# Patient Record
Sex: Female | Born: 1975 | Race: Black or African American | Hispanic: No | Marital: Single | Smoking: Never smoker
Health system: Southern US, Community
[De-identification: ages and names within clinical notes are randomized; demographics above are authoritative.]

## PROBLEM LIST (undated history)

## (undated) ENCOUNTER — Emergency Department (HOSPITAL_COMMUNITY): Discharge status: Absent without leave | Payer: Self-pay

---

## 1997-08-24 ENCOUNTER — Encounter: Admission: RE | Admit: 1997-08-24 | Discharge: 1997-08-24 | Payer: Self-pay | Admitting: Family Medicine

## 1997-09-07 ENCOUNTER — Encounter: Admission: RE | Admit: 1997-09-07 | Discharge: 1997-09-07 | Payer: Self-pay | Admitting: Family Medicine

## 1997-09-11 ENCOUNTER — Encounter: Admission: RE | Admit: 1997-09-11 | Discharge: 1997-09-11 | Payer: Self-pay | Admitting: Family Medicine

## 1998-07-19 ENCOUNTER — Encounter: Admission: RE | Admit: 1998-07-19 | Discharge: 1998-07-19 | Payer: Self-pay | Admitting: Family Medicine

## 1998-07-26 ENCOUNTER — Encounter: Admission: RE | Admit: 1998-07-26 | Discharge: 1998-07-26 | Payer: Self-pay | Admitting: Family Medicine

## 2001-01-27 ENCOUNTER — Encounter: Payer: Self-pay | Admitting: Emergency Medicine

## 2001-01-27 ENCOUNTER — Emergency Department (HOSPITAL_COMMUNITY): Admission: EM | Admit: 2001-01-27 | Discharge: 2001-01-27 | Payer: Self-pay | Admitting: Emergency Medicine

## 2001-10-03 ENCOUNTER — Other Ambulatory Visit: Admission: RE | Admit: 2001-10-03 | Discharge: 2001-10-03 | Payer: Self-pay | Admitting: *Deleted

## 2002-03-16 ENCOUNTER — Inpatient Hospital Stay (HOSPITAL_COMMUNITY): Admission: AD | Admit: 2002-03-16 | Discharge: 2002-03-19 | Payer: Self-pay | Admitting: *Deleted

## 2002-12-25 ENCOUNTER — Ambulatory Visit (HOSPITAL_COMMUNITY): Admission: RE | Admit: 2002-12-25 | Discharge: 2002-12-25 | Payer: Self-pay | Admitting: Obstetrics & Gynecology

## 2003-04-03 ENCOUNTER — Ambulatory Visit (HOSPITAL_COMMUNITY): Admission: RE | Admit: 2003-04-03 | Discharge: 2003-04-03 | Payer: Self-pay | Admitting: Obstetrics & Gynecology

## 2003-05-30 ENCOUNTER — Inpatient Hospital Stay (HOSPITAL_COMMUNITY): Admission: AD | Admit: 2003-05-30 | Discharge: 2003-06-01 | Payer: Self-pay | Admitting: Obstetrics

## 2003-07-25 ENCOUNTER — Emergency Department (HOSPITAL_COMMUNITY): Admission: EM | Admit: 2003-07-25 | Discharge: 2003-07-25 | Payer: Self-pay | Admitting: Emergency Medicine

## 2003-11-01 ENCOUNTER — Emergency Department (HOSPITAL_COMMUNITY): Admission: EM | Admit: 2003-11-01 | Discharge: 2003-11-01 | Payer: Self-pay | Admitting: Emergency Medicine

## 2005-07-05 ENCOUNTER — Emergency Department (HOSPITAL_COMMUNITY): Admission: EM | Admit: 2005-07-05 | Discharge: 2005-07-05 | Payer: Self-pay | Admitting: Emergency Medicine

## 2005-10-14 ENCOUNTER — Ambulatory Visit (HOSPITAL_COMMUNITY): Admission: RE | Admit: 2005-10-14 | Discharge: 2005-10-14 | Payer: Self-pay | Admitting: Obstetrics & Gynecology

## 2006-03-21 ENCOUNTER — Inpatient Hospital Stay (HOSPITAL_COMMUNITY): Admission: AD | Admit: 2006-03-21 | Discharge: 2006-03-23 | Payer: Self-pay | Admitting: Obstetrics
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
## 2010-12-29 ENCOUNTER — Emergency Department (INDEPENDENT_AMBULATORY_CARE_PROVIDER_SITE_OTHER)
Admission: EM | Admit: 2010-12-29 | Discharge: 2010-12-29 | Disposition: A | Discharge status: Hospice | Payer: Self-pay | Source: Home / Self Care | Attending: Emergency Medicine | Admitting: Emergency Medicine

## 2010-12-29 DIAGNOSIS — S93409A Sprain of unspecified ligament of unspecified ankle, initial encounter: Secondary | ICD-10-CM

## 2010-12-29 DIAGNOSIS — IMO0002 Reserved for concepts with insufficient information to code with codable children: Secondary | ICD-10-CM

## 2010-12-29 DIAGNOSIS — S7010XA Contusion of unspecified thigh, initial encounter: Secondary | ICD-10-CM

## 2010-12-29 MED ORDER — CYCLOBENZAPRINE HCL 10 MG PO TABS: 10.0000 mg | ORAL_TABLET | Freq: Three times a day (TID) | ORAL | Status: AC

## 2010-12-29 MED ORDER — MELOXICAM 7.5 MG PO TABS: 7.5000 mg | ORAL_TABLET | Freq: Every day | ORAL | Status: AC
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
## 2012-06-21 ENCOUNTER — Ambulatory Visit: Payer: Self-pay | Admitting: Obstetrics

## 2012-11-03 ENCOUNTER — Encounter: Payer: Self-pay | Admitting: Obstetrics & Gynecology

## 2012-11-03 ENCOUNTER — Ambulatory Visit (INDEPENDENT_AMBULATORY_CARE_PROVIDER_SITE_OTHER): Payer: No Typology Code available for payment source | Admitting: Obstetrics & Gynecology

## 2012-11-03 VITALS — BP 126/89 | HR 73 | Temp 97.2°F | Wt 138.0 lb

## 2012-11-03 DIAGNOSIS — Z3202 Encounter for pregnancy test, result negative: Secondary | ICD-10-CM

## 2012-11-03 DIAGNOSIS — Z30430 Encounter for insertion of intrauterine contraceptive device: Secondary | ICD-10-CM

## 2012-11-03 DIAGNOSIS — Z30432 Encounter for removal of intrauterine contraceptive device: Secondary | ICD-10-CM

## 2012-11-03 DIAGNOSIS — Z975 Presence of (intrauterine) contraceptive device: Secondary | ICD-10-CM

## 2012-11-03 LAB — POCT URINE PREGNANCY: Preg Test, Ur: NEGATIVE

## 2012-11-03 MED ORDER — LEVONORGESTREL 20 MCG/24HR IU IUD: 1.0000 | INTRAUTERINE_SYSTEM | Freq: Once | INTRAUTERINE | Status: DC

## 2012-11-03 NOTE — Progress Notes (Signed)
IUD Removal/Insertion Procedure Note    Procedure Details  Urine pregnancy test was done  and result was negative.  The risks (including infection, bleeding, pain, and uterine perforation) and benefits of the procedure were explained to the patient and Written informed consent was obtained.    Cervix cleansed with Betadine. The IUD strings were grasped and the IUD was removed intact.  Uterus sounded to 9 cm. IUD inserted without difficulty. String visible and trimmed. Patient tolerated procedure well.  An informal U/S showed the IUD in a good position  Condition: Stable  Complications: None  Plan:  The patient was advised to call for any fever or for prolonged or severe pain or bleeding. She was advised to use OTC ibuprofen as needed for mild to moderate pain.

## 2012-11-03 NOTE — Patient Instructions (Signed)
Levonorgestrel intrauterine device (IUD) What is this medicine? LEVONORGESTREL IUD (LEE voe nor jes trel) is a contraceptive (birth control) device. The device is placed inside the uterus by a healthcare professional. It is used to prevent pregnancy and can also be used to treat heavy bleeding that occurs during your period. Depending on the device, it can be used for 3 to 5 years. This medicine may be used for other purposes; ask your health care provider or pharmacist if you have questions. What should I tell my health care provider before I take this medicine? They need to know if you have any of these conditions: -abnormal Pap smear -cancer of the breast, uterus, or cervix -diabetes -endometritis -genital or pelvic infection now or in the past -have more than one sexual partner or your partner has more than one partner -heart disease -history of an ectopic or tubal pregnancy -immune system problems -IUD in place -liver disease or tumor -problems with blood clots or take blood-thinners -use intravenous drugs -uterus of unusual shape -vaginal bleeding that has not been explained -an unusual or allergic reaction to levonorgestrel, other hormones, silicone, or polyethylene, medicines, foods, dyes, or preservatives -pregnant or trying to get pregnant -breast-feeding How should I use this medicine? This device is placed inside the uterus by a health care professional. Talk to your pediatrician regarding the use of this medicine in children. Special care may be needed. Overdosage: If you think you have taken too much of this medicine contact a poison control center or emergency room at once. NOTE: This medicine is only for you. Do not share this medicine with others. What if I miss a dose? This does not apply. What may interact with this medicine? Do not take this medicine with any of the following medications: -amprenavir -bosentan -fosamprenavir This medicine may also interact with  the following medications: -aprepitant -barbiturate medicines for inducing sleep or treating seizures -bexarotene -griseofulvin -medicines to treat seizures like carbamazepine, ethotoin, felbamate, oxcarbazepine, phenytoin, topiramate -modafinil -pioglitazone -rifabutin -rifampin -rifapentine -some medicines to treat HIV infection like atazanavir, indinavir, lopinavir, nelfinavir, tipranavir, ritonavir -St. John's wort -warfarin This list may not describe all possible interactions. Give your health care provider a list of all the medicines, herbs, non-prescription drugs, or dietary supplements you use. Also tell them if you smoke, drink alcohol, or use illegal drugs. Some items may interact with your medicine. What should I watch for while using this medicine? Visit your doctor or health care professional for regular check ups. See your doctor if you or your partner has sexual contact with others, becomes HIV positive, or gets a sexual transmitted disease. This product does not protect you against HIV infection (AIDS) or other sexually transmitted diseases. You can check the placement of the IUD yourself by reaching up to the top of your vagina with clean fingers to feel the threads. Do not pull on the threads. It is a good habit to check placement after each menstrual period. Call your doctor right away if you feel more of the IUD than just the threads or if you cannot feel the threads at all. The IUD may come out by itself. You may become pregnant if the device comes out. If you notice that the IUD has come out use a backup birth control method like condoms and call your health care provider. Using tampons will not change the position of the IUD and are okay to use during your period. What side effects may I notice from receiving this medicine?   Side effects that you should report to your doctor or health care professional as soon as possible: -allergic reactions like skin rash, itching or  hives, swelling of the face, lips, or tongue -fever, flu-like symptoms -genital sores -high blood pressure -no menstrual period for 6 weeks during use -pain, swelling, warmth in the leg -pelvic pain or tenderness -severe or sudden headache -signs of pregnancy -stomach cramping -sudden shortness of breath -trouble with balance, talking, or walking -unusual vaginal bleeding, discharge -yellowing of the eyes or skin Side effects that usually do not require medical attention (report to your doctor or health care professional if they continue or are bothersome): -acne -breast pain -change in sex drive or performance -changes in weight -cramping, dizziness, or faintness while the device is being inserted -headache -irregular menstrual bleeding within first 3 to 6 months of use -nausea This list may not describe all possible side effects. Call your doctor for medical advice about side effects. You may report side effects to FDA at 1-800-FDA-1088. Where should I keep my medicine? This does not apply. NOTE: This sheet is a summary. It may not cover all possible information. If you have questions about this medicine, talk to your doctor, pharmacist, or health care provider.  2013, [employer]. (03/12/2011 1:54:04 PM)  

## 2019-02-20 ENCOUNTER — Ambulatory Visit (HOSPITAL_COMMUNITY)
Admission: EM | Admit: 2019-02-20 | Discharge: 2019-02-20 | Disposition: A | Discharge status: Home / Self Care | Payer: Self-pay

## 2019-02-20 NOTE — ED Notes (Signed)
Patient left during registration because she did not want to pay the copay.

## 2023-05-22 ENCOUNTER — Emergency Department (HOSPITAL_BASED_OUTPATIENT_CLINIC_OR_DEPARTMENT_OTHER)
Admission: EM | Admit: 2023-05-22 | Discharge: 2023-05-22 | Disposition: A | Discharge status: Home / Self Care | Attending: Emergency Medicine | Admitting: Emergency Medicine

## 2023-05-22 ENCOUNTER — Other Ambulatory Visit: Payer: Self-pay

## 2023-05-22 ENCOUNTER — Other Ambulatory Visit (HOSPITAL_BASED_OUTPATIENT_CLINIC_OR_DEPARTMENT_OTHER): Payer: Self-pay

## 2023-05-22 ENCOUNTER — Emergency Department (HOSPITAL_BASED_OUTPATIENT_CLINIC_OR_DEPARTMENT_OTHER): Admitting: Radiology

## 2023-05-22 DIAGNOSIS — X58XXXA Exposure to other specified factors, initial encounter: Secondary | ICD-10-CM | POA: Insufficient documentation

## 2023-05-22 DIAGNOSIS — S39012A Strain of muscle, fascia and tendon of lower back, initial encounter: Secondary | ICD-10-CM | POA: Insufficient documentation

## 2023-05-22 DIAGNOSIS — S3992XA Unspecified injury of lower back, initial encounter: Secondary | ICD-10-CM | POA: Diagnosis present

## 2023-05-22 MED ORDER — LIDOCAINE 5 % EX PTCH
1.0000 | MEDICATED_PATCH | CUTANEOUS | Status: DC
  Administered 2023-05-22: 1 via TRANSDERMAL
  Filled 2023-05-22: qty 1

## 2023-05-22 MED ORDER — CYCLOBENZAPRINE HCL 10 MG PO TABS
10.0000 mg | ORAL_TABLET | Freq: Two times a day (BID) | ORAL | 0 refills | Status: DC | PRN
  Filled 2023-05-22: qty 20, 10d supply, fill #0

## 2023-05-22 MED ORDER — LIDOCAINE 5 % EX PTCH
1.0000 | MEDICATED_PATCH | CUTANEOUS | 0 refills | Status: DC
  Filled 2023-05-22: qty 30, 30d supply, fill #0

## 2023-05-22 MED ORDER — HYDROCODONE-ACETAMINOPHEN 5-325 MG PO TABS
1.0000 | ORAL_TABLET | Freq: Once | ORAL | Status: AC
  Administered 2023-05-22: 1 via ORAL
  Filled 2023-05-22: qty 1

## 2023-05-22 NOTE — ED Provider Notes (Signed)
 [place] EMERGENCY DEPARTMENT AT [facility] Provider Note   CSN: [id number] Arrival date & time: 05/22/23  4098     History  No chief complaint on file.   Kristine Baker is a 48 y.o. female no past medical history presented for 2 days of lower back pain.  Patient dates she has bilateral back pain after going to the chiropractor and having manipulation done.  Patient still able to walk and can feel both of her feet and denies saddle anesthesia, urinary or bowel incontinence, fevers.  Patient is taking Advil to no relief.  Patient denies abdominal pain.    Home Medications Prior to Admission medications   Medication Sig Start Date End Date Taking? Authorizing Provider  cyclobenzaprine (FLEXERIL) 10 MG tablet Take 1 tablet (10 mg total) by mouth 2 (two) times daily as needed for muscle spasms. 05/22/23  Yes Krystle Oberman, Fayrene Fearing T, PA-C  lidocaine (LIDODERM) 5 % Place 1 patch onto the skin daily. Remove & Discard patch within 12 hours or as directed by MD 05/22/23  Yes Netta Corrigan, PA-C  levonorgestrel (MIRENA) 20 MCG/24HR IUD 1 Intra Uterine Device (1 each total) by Intrauterine route once. 11/03/12   Antionette Char, MD      Allergies    Patient has no known allergies.    Review of Systems   Review of Systems  Physical Exam Updated Vital Signs BP (!) 128/96 (BP Location: Right Arm)   Pulse 61   Temp 98 F (36.7 C) (Temporal)   Resp 18   Ht 5\' 5"  (1.651 m)   Wt 59 kg   SpO2 100%   BMI 21.63 kg/m  Physical Exam Constitutional:      General: She is in acute distress.     Comments: Walking around the room in obvious discomfort  Cardiovascular:     Rate and Rhythm: Normal rate.     Pulses: Normal pulses.  Abdominal:     Palpations: Abdomen is soft.     Tenderness: There is no abdominal tenderness. There is no guarding or rebound.  Musculoskeletal:     Comments: Bilateral paralumbar musculature tenderness without abnormalities 5 out of 5 bilateral hip  flexion No midline tenderness or abnormalities palpated  Skin:    General: Skin is warm and dry.     Capillary Refill: Capillary refill takes less than 2 seconds.  Neurological:     Mental Status: She is alert.     Comments: Sensation intact distally 2+ bilateral patellar reflexes Able to ambulate without difficulty does endorse pain when ambulating  Psychiatric:        Mood and Affect: Mood normal.     ED Results / Procedures / Treatments   Labs (all labs ordered are listed, but only abnormal results are displayed) Labs Reviewed - No data to display  EKG None  Radiology DG Lumbar Spine Complete Result Date: 05/22/2023 CLINICAL DATA:  Back pain EXAM: LUMBAR SPINE - COMPLETE 4+ VIEW COMPARISON:  07/25/2003 FINDINGS: There is no evidence of lumbar spine fracture. Alignment is normal. Mild disc space narrowing at L3-4. Minimal lower lumbar facet arthropathy. IMPRESSION: Mild degenerative changes of the lumbar spine. No acute findings. Electronically Signed   By: Duanne Guess D.O.   On: 05/22/2023 10:43    Procedures Procedures    Medications Ordered in ED Medications  lidocaine (LIDODERM) 5 % 1 patch (1 patch Transdermal Patch Applied 05/22/23 1028)  HYDROcodone-acetaminophen (NORCO/VICODIN) 5-325 MG per tablet 1 tablet (1 tablet Oral Given 05/22/23  1027)    ED Course/ Medical Decision Making/ A&P                                 Medical Decision Making Amount and/or Complexity of Data Reviewed Radiology: ordered.  Risk Prescription drug management.   [name] 48 y.o. presented today for back pain. Working DDx that I considered at this time includes, but not limited to, MSK, underlying fracture, epidural hematoma/abscess, cauda equina syndrome, spinal stenosis, spinal malignancy, discitis, spinal infection, spondylitises/ spondylosis, conus medullaris, DDD of the back.  R/o DDx: underlying fracture, epidural hematoma/abscess, cauda equina syndrome, spinal  stenosis, spinal malignancy, discitis, spinal infection, spondylitises/ spondylosis, conus medullaris, DDD of the back : less likely due to history of present illness, physical exam, labs/imaging findings.  Review of prior external notes: 11/03/2012 office visit  Unique Tests and My Interpretation:  Lumbar x-ray: Unremarkable  Social Determinants of Health: none  Discussion with Independent Historian:  Daughter  Discussion of Management of Tests: None  Risk: Medium: prescription drug management  Risk Stratification Score: None  Plan: On exam patient was in distress in obvious pain but otherwise has stable vitals. No neurological deficits and normal neuro exam.  Patient can walk but states is painful.  No loss of bowel or bladder control.  No concern for cauda equina.  No fever, night sweats, weight loss, h/o cancer, IVDU.  Pain did start after manipulation of the chiropractors often so we will get lumbar x-ray however do feel this is a muscle strain in nature and will give pain meds along with lidocaine patch.  Imaging negative.  Patient did improve with medications here.  RICE protocol and pain medicine indicated and discussed with patient.  Patient given Lidoderm patch here and will be given prescription.  Patient given Flexeril as well.  I spoke to the patient how this medication will make them drowsy and do not operate machinery or drive afterwards to which the patient verbalized understanding acceptance of.  Patient was given return precautions. Patient stable for discharge at this time.  Patient verbalized understanding of plan.  This chart was dictated using voice recognition software.  Despite best efforts to proofread,  errors can occur which can change the documentation meaning.        Final Clinical Impression(s) / ED Diagnoses Final diagnoses:  Strain of lumbar region, initial encounter    Rx / DC Orders ED Discharge Orders          Ordered    cyclobenzaprine  (FLEXERIL) 10 MG tablet  2 times daily PRN        05/22/23 1018    lidocaine (LIDODERM) 5 %  Every 24 hours        05/22/23 1018              Remi Deter 05/22/23 1053    Lorre Nick, MD 05/22/23 1054

## 2023-05-22 NOTE — Discharge Instructions (Signed)
We saw you in the ER for back pain. Fortunately, our evaluation is not concerning for emergent pathology such as spinal cord compression or infection.   Often the pain is self limiting, you just need time and supportive medications such as ibuprofen/tylenol every 6-8 hours for the next few days. Take the muscle relaxant as needed (however do not operate machinery or drive after using this medication due to its sedating side effects), use the lidocaine patches, and see your primary care doctor for further management if the symptoms continue to linger.   Please use the back exercises to strengthen the back muscles and be very careful with activities in future to prevent similar painful events.  Please return to the ER if your pain becomes excruciating or you start developing new numbness, weakness, urinary incontinence (peeing on self without warning), urinary retention (not able to pee despite bladder feeling full), inability to defecate, pins and needles sensation by your ano-genital area.

## 2023-05-22 NOTE — ED Triage Notes (Signed)
 Pt had visit to chiropractor on Wednesday ,had adjustment to her lower back. On Thursday her lower back started hurting and has increased.

## 2023-07-01 ENCOUNTER — Ambulatory Visit: Admitting: Physician Assistant

## 2023-07-02 ENCOUNTER — Ambulatory Visit (INDEPENDENT_AMBULATORY_CARE_PROVIDER_SITE_OTHER): Admitting: Family

## 2023-07-02 ENCOUNTER — Encounter: Payer: Self-pay | Admitting: Family

## 2023-07-02 VITALS — BP 131/87 | HR 81 | Temp 98.4°F | Ht 65.0 in | Wt 131.1 lb

## 2023-07-02 DIAGNOSIS — M545 Low back pain, unspecified: Secondary | ICD-10-CM

## 2023-07-02 DIAGNOSIS — Z1211 Encounter for screening for malignant neoplasm of colon: Secondary | ICD-10-CM | POA: Diagnosis not present

## 2023-07-02 MED ORDER — CYCLOBENZAPRINE HCL 10 MG PO TABS: 10.0000 mg | ORAL_TABLET | Freq: Two times a day (BID) | ORAL | 0 refills | Status: DC | PRN

## 2023-07-02 MED ORDER — NAPROXEN 500 MG PO TABS: 500.0000 mg | ORAL_TABLET | Freq: Two times a day (BID) | ORAL | 0 refills | Status: DC

## 2023-07-02 NOTE — Progress Notes (Unsigned)
 New Patient Office Visit  Subjective:  Patient ID: Kristine Baker, female    DOB: Sep 27, 1975  Age: 48 y.o. MRN: [id number]  CC:  Chief Complaint  Patient presents with  . New Patient (Initial Visit)  . Back Pain    Pt c/o lower back pain, present for 2 months. Has tried flexeril  and lidocaine , which did not help sx.    HPI Kristine Baker presents for establishing care today.   Discussed the use of AI scribe software for clinical note transcription with the patient, who gave verbal consent to proceed.  History of Present Illness Kristine Baker is a 48 year old female who presents with persistent back pain following a chiropractic adjustment.  She experiences persistent back pain for two months following a chiropractic adjustment. The pain is severe, described as a tight sensation in the lower back around the sacrum, and worsens with prolonged sitting, causing muscle spasms at night.  She has used Lidoderm  patches and Flexeril , with Flexeril  providing minimal relief and causing daytime drowsiness. Ibuprofen is taken every other day with slight relief, and Biofreeze is applied for comfort. An x-ray showed no significant findings.  The pain limits her physical activity, making daily tasks like tying shoes or lifting her leg difficult. 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 There is no tingling, numbness, or radiation of pain down her legs, with pain localized to the lower back.  Assessment & Plan Back pain with muscle spasms - Chronic back pain with muscle spasms for two months, likely related to chiropractic adjustment. Pain localized to sacral region, no radiation or neurological symptoms. X-ray unremarkable. Differential includes muscle strain and lumbar disc degeneration. Pain exacerbated by activity, relieved by ibuprofen and lidocaine  patches. Flexeril  provides relief but causes drowsiness. - Refer to orthopedic specialist for further evaluation and  potential MRI. - Prescribe naproxen for pain management. - Advise use of heat application to relax muscles and improve circulation. - Continue use of lidocaine  patches as needed. - Prescribe additional Flexeril  for evening use to avoid daytime drowsiness. - Advise to avoid heavy lifting and limit physical strain.  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 Further evaluation necessary to assess extent and impact on symptoms. - Refer to orthopedic specialist for evaluation and management.  Subjective:     Outpatient Medications Prior to Visit  Medication Sig Dispense Refill  . levonorgestrel  (MIRENA ) 20 MCG/DAY IUD 1 each by Intrauterine route once.    . lidocaine  (LIDODERM ) 5 % Place 1 patch onto the skin daily. Remove & Discard patch within 12 hours or as directed by MD (Patient not taking: Reported on 07/02/2023) 30 patch 0  . cyclobenzaprine  (FLEXERIL ) 10 MG tablet Take 1 tablet (10 mg total) by mouth 2 (two) times daily as needed for muscle spasms. (Patient not taking: Reported on 07/02/2023) 20 tablet 0  . levonorgestrel  (MIRENA ) 20 MCG/24HR IUD 1 Intra Uterine Device (1 each total) by Intrauterine route once. (Patient not taking: Reported on 07/02/2023) 1 each 0   No facility-administered medications prior to visit.   History reviewed. No pertinent past medical history. History reviewed. No pertinent surgical history.  Objective:   Today's Vitals: BP 131/87 (BP Location: Left Arm, Cuff Size: Large)   Pulse 81   Temp 98.4 F (36.9 C) (Temporal)   Ht 5\' 5"  (1.651 m)   Wt 131 lb 2 oz (59.5 kg)   SpO2 99%   BMI 21.82 kg/m  Physical Exam Vitals and nursing note reviewed.  Constitutional:      Appearance: Normal appearance.  Cardiovascular:     Rate and Rhythm: Normal rate and regular rhythm.  Pulmonary:     Effort: Pulmonary effort is normal.     Breath sounds: Normal breath sounds.  Musculoskeletal:     Lumbar  back: Bony tenderness present. Decreased range of motion.  Skin:    General: Skin is warm and dry.  Neurological:     Mental Status: She is alert.  Psychiatric:        Mood and Affect: Mood normal.        Behavior: Behavior normal.   Meds ordered this encounter  Medications  . naproxen (NAPROSYN) 500 MG tablet    Sig: Take 1 tablet (500 mg total) by mouth 2 (two) times daily with a meal.    Dispense:  30 tablet    Refill:  0    Supervising Provider:   ANDY, CAMILLE L [[id number]]  . cyclobenzaprine  (FLEXERIL ) 10 MG tablet    Sig: Take 1-2 tablets (10-20 mg total) by mouth 2 (two) times daily as needed for muscle spasms (Back pain. Can cause drowsiness.).    Dispense:  20 tablet    Refill:  0    Supervising Provider:   ANDY, CAMILLE L [[id number]]    Versa Gore, NP

## 2023-07-02 NOTE — Patient Instructions (Addendum)
 Welcome to [facility], It was a pleasure meeting you today!    As discussed, I have sent Naproxen and the Flexeril  refill to your pharmacy to help with your back pain.  I have sent a referral to our orthopedic clinic, they will call you to schedule.  Please schedule a physical with fasting labs at your convenience.   PLEASE NOTE: If you had any LAB tests please let us  know if you have not heard back within a few days. You may see your results on MyChart before we have a chance to review them but we will give you a call once they are reviewed by us . If we ordered any REFERRALS today, please let us  know if you have not heard from their office within the next week.  Let us  know through MyChart if you are needing REFILLS, or have your pharmacy send us  the request. You can also use MyChart to communicate with me or any office staff.  Please try these tips to maintain a healthy lifestyle: It is important that you exercise regularly at least 30 minutes 5 times a week. Think about what you will eat, plan ahead. Choose whole foods, & think  "clean, green, fresh or frozen" over canned, processed or packaged foods which are more sugary, salty, and fatty. 70 to 75% of food eaten should be fresh vegetables and protein. 2-3  meals daily with healthy snacks between meals, but must be whole fruit, protein or vegetables. Aim to eat over a 10 hour period when you are active, for example, 7am to 5pm, and then STOP after your last meal of the day, drinking only water.  Shorter eating windows, 6-8 hours, are showing benefits in heart disease and blood sugar regulation. Drink water every day! Shoot for 64 ounces daily = 8 cups, no other drink is as healthy! Fruit juice is best enjoyed in a healthy way, by EATING the fruit.

## 2023-07-08 ENCOUNTER — Encounter: Payer: Self-pay | Admitting: Family

## 2023-07-08 ENCOUNTER — Other Ambulatory Visit (HOSPITAL_COMMUNITY)
Admission: RE | Admit: 2023-07-08 | Discharge: 2023-07-08 | Disposition: A | Discharge status: Home / Self Care | Source: Ambulatory Visit | Attending: Family | Admitting: Family

## 2023-07-08 ENCOUNTER — Ambulatory Visit (INDEPENDENT_AMBULATORY_CARE_PROVIDER_SITE_OTHER): Admitting: Family

## 2023-07-08 VITALS — BP 128/88 | HR 92 | Temp 98.1°F | Ht 65.0 in | Wt 130.5 lb

## 2023-07-08 DIAGNOSIS — Z13220 Encounter for screening for lipoid disorders: Secondary | ICD-10-CM | POA: Diagnosis not present

## 2023-07-08 DIAGNOSIS — Z113 Encounter for screening for infections with a predominantly sexual mode of transmission: Secondary | ICD-10-CM | POA: Insufficient documentation

## 2023-07-08 DIAGNOSIS — Z124 Encounter for screening for malignant neoplasm of cervix: Secondary | ICD-10-CM | POA: Diagnosis present

## 2023-07-08 DIAGNOSIS — Z1159 Encounter for screening for other viral diseases: Secondary | ICD-10-CM

## 2023-07-08 DIAGNOSIS — M545 Low back pain, unspecified: Secondary | ICD-10-CM | POA: Diagnosis not present

## 2023-07-08 DIAGNOSIS — Z114 Encounter for screening for human immunodeficiency virus [HIV]: Secondary | ICD-10-CM

## 2023-07-08 DIAGNOSIS — Z0000 Encounter for general adult medical examination without abnormal findings: Secondary | ICD-10-CM | POA: Diagnosis not present

## 2023-07-08 LAB — CBC WITH DIFFERENTIAL/PLATELET
Basophils Absolute: 0 10*3/uL (ref 0.0–0.1)
Basophils Relative: 0.5 % (ref 0.0–3.0)
Eosinophils Absolute: 0.3 10*3/uL (ref 0.0–0.7)
Eosinophils Relative: 5.6 % — ABNORMAL HIGH (ref 0.0–5.0)
HCT: 41.2 % (ref 36.0–46.0)
Hemoglobin: 14 g/dL (ref 12.0–15.0)
Lymphocytes Relative: 41.9 % (ref 12.0–46.0)
Lymphs Abs: 2.5 10*3/uL (ref 0.7–4.0)
MCHC: 34 g/dL (ref 30.0–36.0)
MCV: 84 fl (ref 78.0–100.0)
Monocytes Absolute: 0.5 10*3/uL (ref 0.1–1.0)
Monocytes Relative: 8.1 % (ref 3.0–12.0)
Neutro Abs: 2.6 10*3/uL (ref 1.4–7.7)
Neutrophils Relative %: 43.9 % (ref 43.0–77.0)
Platelets: 266 10*3/uL (ref 150.0–400.0)
RBC: 4.9 Mil/uL (ref 3.87–5.11)
RDW: 14.8 % (ref 11.5–15.5)
WBC: 5.9 10*3/uL (ref 4.0–10.5)

## 2023-07-08 LAB — LIPID PANEL
Cholesterol: 185 mg/dL (ref 0–200)
HDL: 62 mg/dL (ref >39.00)
LDL Cholesterol: 104 mg/dL — ABNORMAL HIGH (ref 0–99)
NonHDL: 122.85
Total CHOL/HDL Ratio: 3
Triglycerides: 94 mg/dL (ref 0.0–149.0)
VLDL: 18.8 mg/dL (ref 0.0–40.0)

## 2023-07-08 LAB — TSH: TSH: 4.59 u[IU]/mL (ref 0.35–5.50)

## 2023-07-08 LAB — COMPREHENSIVE METABOLIC PANEL WITH GFR
ALT: 15 U/L (ref 0–35)
AST: 21 U/L (ref 0–37)
Albumin: 4.1 g/dL (ref 3.5–5.2)
Alkaline Phosphatase: 54 U/L (ref 39–117)
BUN: 9 mg/dL (ref 6–23)
CO2: 25 meq/L (ref 19–32)
Calcium: 9.1 mg/dL (ref 8.4–10.5)
Chloride: 109 meq/L (ref 96–112)
Creatinine, Ser: 0.74 mg/dL (ref 0.40–1.20)
GFR: 97.35 mL/min (ref >60.00)
Glucose, Bld: 81 mg/dL (ref 70–99)
Potassium: 3.8 meq/L (ref 3.5–5.1)
Sodium: 140 meq/L (ref 135–145)
Total Bilirubin: 1 mg/dL (ref 0.2–1.2)
Total Protein: 7.4 g/dL (ref 6.0–8.3)

## 2023-07-08 MED ORDER — LIDOCAINE 5 % EX PTCH: 1.0000 | MEDICATED_PATCH | CUTANEOUS | 2 refills | Status: DC

## 2023-07-08 MED ORDER — PREDNISONE 10 MG PO TABS: ORAL_TABLET | ORAL | 0 refills | Status: DC

## 2023-07-08 NOTE — Patient Instructions (Addendum)
 It was very nice to see you today!   I will review your lab results via MyChart in a few days.  I have sent in more of the Lidocaine  patches for your back. Take the Naproxen twice a day or as needed for the pain until seen by the specialist. Be sure to call the Orthopedic specialist as they have tried to call you to schedule. Also check on the gastroenterology referral and see if they left you a call back number.     PLEASE NOTE:  If you had any lab tests please let us  know if you have not heard back within a few days. You may see your results on MyChart before we have a chance to review them but we will give you a call once they are reviewed by us . If we ordered any referrals today, please let us  know if you have not heard from their office within the next week.

## 2023-07-08 NOTE — Progress Notes (Signed)
 Phone [phone]  Subjective:   Patient is a 48 y.o. female presenting for annual physical.    Chief Complaint  Patient presents with   Annual Exam    Non fasting w/ labs   Discussed the use of AI scribe software for clinical note transcription with the patient, who gave verbal consent to proceed.  History of Present Illness Kristine Baker is a 48 year old female who presents for a routine Pap smear and evaluation of back pain.  Kristine Baker has no history of abnormal Pap smear results or positive HPV tests.  Kristine Baker experiences significant back pain and swelling. A chiropractor recommended Kristine Baker inquire about prednisone for management. Muscle relaxers previously prescribed helped her sleep but did not relieve the pain. Her work is physically demanding, and Kristine Baker incorporates leg lifts and stretches into her routine. Despite the pain, Kristine Baker continues to work, though it is challenging. The pain worsens with prolonged sitting, and Kristine Baker experiences stiffness and difficulty rising after lying down.  See problem oriented charting- ROS- full  review of systems was completed and negative except for: what is noted in HPI above.  The following were reviewed and entered/updated in epic: No past medical history on file. There are no active problems to display for this patient.  No past surgical history on file.  Family History  Problem Relation Age of Onset   COPD Mother    Stroke Mother    Anxiety disorder Mother    Hypertension Father    Early death Sister    Early death Son     Medications- reviewed and updated Current Outpatient Medications  Medication Sig Dispense Refill   cyclobenzaprine  (FLEXERIL ) 10 MG tablet Take 1-2 tablets (10-20 mg total) by mouth 2 (two) times daily as needed for muscle spasms (Back pain. Can cause drowsiness.). 20 tablet 0   levonorgestrel  (MIRENA ) 20 MCG/DAY IUD 1 each by Intrauterine route once.     naproxen (NAPROSYN) 500 MG tablet Take 1 tablet (500 mg total) by  mouth 2 (two) times daily with a meal. 30 tablet 0   [START ON 07/09/2023] predniSONE (DELTASONE) 10 MG tablet Take with breakfast:  6 tab day 1, 5 tab day 2-3, 4 tab day 4-5, 3 tab day 6, 2 tab day 7 31 tablet 0   lidocaine  (LIDODERM ) 5 % Place 1 patch onto the skin daily. Remove & Discard patch within 12 hours or as directed by MD 30 patch 2   No current facility-administered medications for this visit.    Allergies-reviewed and updated No Known Allergies  Social History   Social History Narrative   Not on file    Objective:  BP 128/88 (BP Location: Left Arm, Patient Position: Sitting, Cuff Size: Large)   Pulse 92   Temp 98.1 F (36.7 C) (Temporal)   Ht 5\' 5"  (1.651 m)   Wt 130 lb 8 oz (59.2 kg)   SpO2 98%   BMI 21.72 kg/m  Physical Exam Vitals and nursing note reviewed.  Constitutional:      Appearance: Normal appearance.  HENT:     Head: Normocephalic.     Right Ear: Tympanic membrane normal.     Left Ear: Tympanic membrane normal.     Nose: Nose normal.     Mouth/Throat:     Mouth: Mucous membranes are moist.  Eyes:     Pupils: Pupils are equal, round, and reactive to light.  Cardiovascular:     Rate and Rhythm: Normal rate and regular rhythm.  Pulmonary:     Effort: Pulmonary effort is normal.     Breath sounds: Normal breath sounds.  Musculoskeletal:     Cervical back: Normal range of motion.     Lumbar back: Tenderness and bony tenderness present. Decreased range of motion.  Lymphadenopathy:     Cervical: No cervical adenopathy.  Skin:    General: Skin is warm and dry.  Neurological:     Mental Status: Kristine Baker is alert.  Psychiatric:        Mood and Affect: Mood normal.        Behavior: Behavior normal.      Assessment and Plan   Health Maintenance counseling: 1. Anticipatory guidance: Patient counseled regarding regular dental exams q6 months, eye exams,  avoiding smoking and second hand smoke, limiting alcohol to 1 beverage per day, no illicit drugs.    2. Risk factor reduction:  Advised patient of need for regular exercise and diet rich with fruits and vegetables to reduce risk of heart attack and stroke. Exercise- .  Wt Readings from Last 3 Encounters:  07/08/23 130 lb 8 oz (59.2 kg)  07/02/23 131 lb 2 oz (59.5 kg)  05/22/23 130 lb (59 kg)   3. Immunizations/screenings/ancillary studies  There is no immunization history on file for this patient. Health Maintenance Due  Topic Date Due   HIV Screening  Never done   Cervical Cancer Screening (HPV/Pap Cotest)  Never done    4. Cervical cancer screening- doing today 5. Breast cancer screening-  mammogram   6. Colon cancer screening - ordered 7. Skin cancer screening- advised regular sunscreen use. Denies worrisome, changing, or new skin lesions.  8. Birth control/STD check- none/ checking today 9. Osteoporosis screening- N/A  10. Alcohol screening: social 11. Smoking associated screening (lung cancer screening, AAA screen 65-75, UA)- never smoker Assessment & Plan Back pain Chronic back pain with swelling, possibly worsened by chiropractic treatment. Muscle relaxers ineffective due to drowsiness. Prednisone recommended for anti-inflammatory effects. - Prescribe prednisone with tapering dose, take in morning post-breakfast. Do not take Naproxen while on this. - Discuss potential side effects: insomnia, increased appetite, irritability, elevated blood pressure. - Call Orthopedic office number provided, referred last visit, but were unable to reach patient. - Advise against overexertion despite increased energy from prednisone. - Also sent refill for Lidocaine  patches to use prn for pain.  Cervical cancer screening -- Attempted to obtain PAP specimen with pt in lithotomy position, but due to her lumbar pain it restricts her ability to raise her legs in position. Pt given swab/broom and instructed to insert deep into vagina, swirl around, and will use this for testing.  - Cytology -  PAP  Annual physical exam (Primary)  - Comprehensive metabolic panel with GFR - CBC with Differential/Platelet - Lipid panel - TSH  Screening for HIV (human immunodeficiency virus)  - HIV antibody (with reflex)  Need for hepatitis C screening test  - Hepatitis C Antibody  Routine screening for STI (sexually transmitted infection)  - Cytology - PAP   Recommended follow up:  Return for any future concerns, Complete physical w/fasting labs. Future Appointments  Date Time Provider Department Center  07/11/2024  9:30 AM Versa Gore, NP [facility]    Lab/Order associations: not fasting   Versa Gore, NP

## 2023-07-09 LAB — HIV ANTIBODY (ROUTINE TESTING W REFLEX): HIV 1&2 Ab, 4th Generation: NONREACTIVE

## 2023-07-09 LAB — HEPATITIS C ANTIBODY: Hepatitis C Ab: NONREACTIVE

## 2023-07-12 ENCOUNTER — Ambulatory Visit: Payer: Self-pay | Admitting: Family

## 2023-07-12 NOTE — Progress Notes (Signed)
 Glucose, electrolytes, blood count, thyroid, liver & kidney function are all normal. cholesterol numbers good, just LDL (bad #) slightly high. Try to reduce red meat, fried foods, dairy intake. Increase intake of whole foods: vegetables, fruit, protein.

## 2023-07-14 ENCOUNTER — Ambulatory Visit (AMBULATORY_SURGERY_CENTER)

## 2023-07-14 VITALS — Ht 65.0 in | Wt 130.0 lb

## 2023-07-14 DIAGNOSIS — Z1211 Encounter for screening for malignant neoplasm of colon: Secondary | ICD-10-CM

## 2023-07-14 MED ORDER — NA SULFATE-K SULFATE-MG SULF 17.5-3.13-1.6 GM/177ML PO SOLN: 1.0000 | Freq: Once | ORAL | 0 refills | Status: AC

## 2023-07-14 NOTE — Progress Notes (Signed)
 No egg or soy allergy known to patient  No issues known to pt with past sedation with any surgeries or procedures Patient denies ever being told they had issues or difficulty with intubation  No FH of Malignant Hyperthermia Pt is not on diet pills Pt is not on  home 02  Pt is not on blood thinners  Pt denies issues with constipation  No A fib or A flutter Have any cardiac testing pending-- no  LOA: independent  Prep: suprep   Patient's chart reviewed by Cathlyn Parsons CNRA prior to previsit and patient appropriate for the [facility].  Previsit completed and red dot placed by patient's name on their procedure day (on provider's schedule).     PV completed with patient. Prep instructions sent to home address.

## 2023-07-15 LAB — CYTOLOGY - PAP
Comment: NEGATIVE
Diagnosis: NEGATIVE
High risk HPV: NEGATIVE

## 2023-07-16 ENCOUNTER — Encounter: Payer: Self-pay | Admitting: Physical Medicine and Rehabilitation

## 2023-07-16 ENCOUNTER — Ambulatory Visit: Admitting: Physical Medicine and Rehabilitation

## 2023-07-16 DIAGNOSIS — M47819 Spondylosis without myelopathy or radiculopathy, site unspecified: Secondary | ICD-10-CM | POA: Diagnosis not present

## 2023-07-16 DIAGNOSIS — M47816 Spondylosis without myelopathy or radiculopathy, lumbar region: Secondary | ICD-10-CM | POA: Diagnosis not present

## 2023-07-16 DIAGNOSIS — M545 Low back pain, unspecified: Secondary | ICD-10-CM

## 2023-07-16 DIAGNOSIS — G8929 Other chronic pain: Secondary | ICD-10-CM | POA: Diagnosis not present

## 2023-07-16 NOTE — Progress Notes (Unsigned)
 Kristine Baker - 48 y.o. female MRN [id number]  Date of birth: 05/13/1975  Office Visit Note: Visit Date: 07/16/2023 PCP: Versa Gore, NP Referred by: Versa Gore, NP  Subjective: Chief Complaint  Patient presents with   Lower Back - Pain   HPI: Kristine Baker is a 48 y.o. female who comes in today per the request of Versa Gore, NP for evaluation of chronic, worsening and severe bilateral lower back pain. Pain started about 3 months ago after chiropractic adjustments. Her pain worsens with activity and movement. She describes pain as sore and spasm sensation, currently rates as 7 out of 10. States her lower back feels stiff in the morning. Some relief of pain with home exercise regimen, rest and use of medications. History of chiropractic treatments that caused increased pain. She was evaluated in the emergency room in March for same lower back issues. She was prescribed Lidocaine  patches and Norco, some short term relief of pain with these medications She recently started oral Prednisone  and Naproxen  prescribed by her PCP. Recent lumbar radiographs show mild degenerative changes of the lumbar spine, no spondylolisthesis, no pars defects. No history of lumbar surgery/injections. Patient is full time [employer] account executive. Patient denies focal weakness, numbness and tingling. No recent trauma or falls.      Review of Systems  Musculoskeletal:  Positive for back pain and myalgias.  Neurological:  Negative for tingling, sensory change, focal weakness and weakness.  All other systems reviewed and are negative.  Otherwise per HPI.  Assessment & Plan: Visit Diagnoses:    ICD-10-CM   1. Chronic bilateral low back pain without sciatica  M54.50 Ambulatory referral to Physical Therapy   G89.29 MR LUMBAR SPINE WO CONTRAST    2. Spondylosis without myelopathy or radiculopathy  M47.819 Ambulatory referral to Physical Therapy    MR LUMBAR SPINE WO CONTRAST    3. Facet arthritis  of lumbar region  M47.816 Ambulatory referral to Physical Therapy    MR LUMBAR SPINE WO CONTRAST       Plan: Findings:  Chronic, worsening and severe bilateral lower back pain. No radicular symptoms down the legs. Patient continues to have pain despite good conservative therapies such as chiropractic treatments, home exercise regimen, rest and use of medications. Patients clinical presentation and exam are complex, differentials include facet mediated pain vs myofascial pain syndrome. She is very stiff today during exam, she is also quite tense all over her body.Recent lumbar radiographs show mild facet arthropathy to lower lumbar spine. We discussed treatment plan in detail today. Given chronicity of her symptoms and continued severe pain I placed order for lumbar MRI imaging. I also placed order for short course of formal physical therapy with a focus on manual treatment and core strengthening. She can continue with oral Prednisone  and Naproxen . I will see her back for lumbar MRI review and to discuss options. Would also consider obtaining rheumatological panel to rule out any type of inflammatory condition due to diffuse stiffness. No red flag symptoms noted upon exam today.     Meds & Orders: No orders of the defined types were placed in this encounter.   Orders Placed This Encounter  Procedures   MR LUMBAR SPINE WO CONTRAST   Ambulatory referral to Physical Therapy    Follow-up: Return for Lumbar MRI review.   Procedures: No procedures performed      Clinical History: No specialty comments available.   She reports that she has never smoked. She has never used  smokeless tobacco. No results for input(s): "HGBA1C", "LABURIC" in the last 8760 hours.  Objective:  VS:  HT:    WT:   BMI:     BP:   HR: bpm  TEMP: ( )  RESP:  Physical Exam Vitals and nursing note reviewed.  HENT:     Head: Normocephalic and atraumatic.     Right Ear: External ear normal.     Left Ear: External ear  normal.     Nose: Nose normal.     Mouth/Throat:     Mouth: Mucous membranes are moist.  Eyes:     Extraocular Movements: Extraocular movements intact.  Cardiovascular:     Rate and Rhythm: Normal rate.     Pulses: Normal pulses.  Pulmonary:     Effort: Pulmonary effort is normal.  Abdominal:     General: Abdomen is flat. There is no distension.  Musculoskeletal:        General: Tenderness present.     Cervical back: Normal range of motion.     Comments: Patient rises from seated position to standing without difficulty. Mild pain noted with facet loading and lumbar extension. 5/5 strength noted with bilateral hip flexion, knee flexion/extension, ankle dorsiflexion/plantarflexion and EHL. No clonus noted bilaterally. No pain upon palpation of greater trochanters. No pain with internal/external rotation of bilateral hips. Sensation intact bilaterally. Negative slump test bilaterally. Ambulates without aid, gait steady.     Skin:    General: Skin is warm and dry.     Capillary Refill: Capillary refill takes less than 2 seconds.  Neurological:     General: No focal deficit present.     Mental Status: She is alert and oriented to person, place, and time.  Psychiatric:        Mood and Affect: Mood normal.        Behavior: Behavior normal.     Ortho Exam  Imaging: No results found.  Past Medical/Family/Surgical/Social History: Medications & Allergies reviewed per EMR, new medications updated. There are no active problems to display for this patient.  No past medical history on file. Family History  Problem Relation Age of Onset   COPD Mother    Stroke Mother    Anxiety disorder Mother    Hypertension Father    Early death Sister    Early death Son    Colon cancer Neg Hx    Rectal cancer Neg Hx    Stomach cancer Neg Hx    No past surgical history on file. Social History   Occupational History   Not on file  Tobacco Use   Smoking status: Never   Smokeless tobacco:  Never  Vaping Use   Vaping status: Not on file  Substance and Sexual Activity   Alcohol use: No   Drug use: No   Sexual activity: Yes    Birth control/protection: Condom, I.U.D.

## 2023-07-16 NOTE — Progress Notes (Unsigned)
 Pain located in lower back. Been having this pain for the past 3 months. Patient says she had went to the chiropractor and says the pain started after that. No PT, does home exercises. She takes Prednisone , eases a little bit. Can't lift leg. Gets real stiff when she lay down, got to roll out of bed. When sleeping, have muscle spasms.

## 2023-07-20 NOTE — Progress Notes (Signed)
 PAP smear normal, HPV negative. Recheck in 5 years. Thanks.

## 2023-07-22 ENCOUNTER — Encounter: Payer: Self-pay | Admitting: Orthopedic Surgery

## 2023-07-25 ENCOUNTER — Other Ambulatory Visit

## 2023-08-03 ENCOUNTER — Telehealth: Payer: Self-pay | Admitting: Physical Medicine and Rehabilitation

## 2023-08-03 NOTE — Telephone Encounter (Signed)
 Patient called and said do you think you could put a rush on her MRI results? CB#[phone]

## 2023-08-03 NOTE — Therapy (Unsigned)
 OUTPATIENT PHYSICAL THERAPY THORACOLUMBAR EVALUATION   Patient Name: Kristine Baker MRN: [id number] DOB:[birth year], 48 y.o., female Today's Date: 08/05/2023  END OF SESSION:   No past medical history on file. No past surgical history on file. There are no active problems to display for this patient.   PCP: Versa Gore, NP   REFERRING PROVIDER: Darryll Eng, NP  REFERRING DIAG: M54.50,G89.29 (ICD-10-CM) - Chronic bilateral low back pain without sciatica M47.819 (ICD-10-CM) - Spondylosis without myelopathy or radiculopathy M47.816 (ICD-10-CM) - Facet arthritis of lumbar region  Rationale for Evaluation and Treatment: Rehabilitation  THERAPY DIAG:  No diagnosis found.  ONSET DATE: chronic  SUBJECTIVE:                                                                                                                                                                                           SUBJECTIVE STATEMENT: Pain located in lower back. Been having this pain for the past 3 months. Patient says she had went to the chiropractor and says the pain started after that. No PT, does home exercises. She takes Prednisone , eases a little bit. Can't lift leg. Gets real stiff when she lay down, got to roll out of bed. When sleeping, have muscle spasms.    PERTINENT HISTORY:  HPI: [name] is a 48 y.o. female who comes in today per the request of Versa Gore, NP for evaluation of chronic, worsening and severe bilateral lower back pain. Pain started about 3 months ago after chiropractic adjustments. Her pain worsens with activity and movement. She describes pain as sore and spasm sensation, currently rates as 7 out of 10. States her lower back feels stiff in the morning. Some relief of pain with home exercise regimen, rest and use of medications. History of chiropractic treatments that caused increased pain. She was evaluated in the emergency room in March for same lower back  issues. She was prescribed Lidocaine  patches and Norco, some short term relief of pain with these medications She recently started oral Prednisone  and Naproxen  prescribed by her PCP. Recent lumbar radiographs show mild degenerative changes of the lumbar spine, no spondylolisthesis, no pars defects. No history of lumbar surgery/injections. Patient is full time [employer] account executive. Patient denies focal weakness, numbness and tingling. No recent trauma or falls.   PAIN:  Are you having pain? Yes: NPRS scale: *** Pain location: *** Pain description: *** Aggravating factors: *** Relieving factors: ***  PRECAUTIONS: None  RED FLAGS: None   WEIGHT BEARING RESTRICTIONS: No  FALLS:  Has patient fallen in last 6 months? No  OCCUPATION: forklift driver  PLOF: Independent  PATIENT GOALS:  To manage my back pain  NEXT MD VISIT: TBD  OBJECTIVE:  Note: Objective measures were completed at Evaluation unless otherwise noted.  DIAGNOSTIC FINDINGS:  FINDINGS: There is no evidence of lumbar spine fracture. Alignment is normal. Mild disc space narrowing at L3-4. Minimal lower lumbar facet arthropathy.   IMPRESSION: Mild degenerative changes of the lumbar spine. No acute findings.     Electronically Signed   By: Leverne Reading D.O.   On: 05/22/2023 10:43  PATIENT SURVEYS:  Modified Oswestry ***    MUSCLE LENGTH: Hamstrings: Right *** deg; Left *** deg Andy Bannister test: Right *** deg; Left *** deg  POSTURE: {posture:25561}  PALPATION: ***  LUMBAR ROM:   AROM eval  Flexion   Extension   Right lateral flexion   Left lateral flexion   Right rotation   Left rotation    (Blank rows = not tested)  LOWER EXTREMITY ROM:     {AROM/PROM:27142}  Right eval Left eval  Hip flexion    Hip extension    Hip abduction    Hip adduction    Hip internal rotation    Hip external rotation    Knee flexion    Knee extension    Ankle dorsiflexion    Ankle plantarflexion    Ankle  inversion    Ankle eversion     (Blank rows = not tested)  LOWER EXTREMITY MMT:    MMT Right eval Left eval  Hip flexion    Hip extension    Hip abduction    Hip adduction    Hip internal rotation    Hip external rotation    Knee flexion    Knee extension    Ankle dorsiflexion    Ankle plantarflexion    Ankle inversion    Ankle eversion     (Blank rows = not tested)  LUMBAR SPECIAL TESTS:  Straight leg raise test: {pos/neg:25243} and Slump test: {pos/neg:25243}  FUNCTIONAL TESTS:  30 seconds chair stand test  GAIT: Distance walked: 40ftx2 Assistive device utilized: None Level of assistance: Complete Independence Comments: ***  TREATMENT DATE:  [facility] Adult PT Treatment:                                                DATE: *** Eval and HEP Self Care: Additional minutes spent for educating on updated Therapeutic Home Exercise Program as well as comparing current status to condition at start of symptoms. This included exercises focusing on stretching, strengthening, with focus on eccentric aspects. Long term goals include an improvement in range of motion, strength, endurance as well as avoiding reinjury. Patient's frequency would include in 1-2 times a day, 3-5 times a week for a duration of 6-12 weeks. Proper technique shown and discussed handout in great detail. All questions were discussed and addressed.  PATIENT EDUCATION:  Education details: Discussed eval findings, rehab rationale and POC and patient is in agreement  Person educated: Patient Education method: Explanation and Handouts Education comprehension: verbalized understanding and needs further education  HOME EXERCISE PROGRAM: ***  ASSESSMENT:  CLINICAL IMPRESSION: Patient is a 48 y.o. female who was seen today for physical therapy evaluation and treatment for ***.    OBJECTIVE IMPAIRMENTS: {opptimpairments:25111}.   ACTIVITY LIMITATIONS: {activitylimitations:27494}  PERSONAL FACTORS: {Personal factors:25162} are also affecting patient's functional outcome.   REHAB POTENTIAL: Fair due to chronicity and MOI  CLINICAL DECISION MAKING: Evolving/moderate complexity  EVALUATION COMPLEXITY: Low   GOALS: Goals reviewed with patient? No  SHORT TERM GOALS: Target date: ***  Patient to demonstrate independence in HEP  Baseline: Goal status: INITIAL  2.  *** Baseline:  Goal status: INITIAL  3.  *** Baseline:  Goal status: INITIAL  4.  *** Baseline:  Goal status: INITIAL  5.  *** Baseline:  Goal status: INITIAL  6.  *** Baseline:  Goal status: INITIAL  LONG TERM GOALS: Target date: ***  Patient will acknowledge ***/10 pain at least once during episode of care   Baseline:  Goal status: INITIAL  2.  Patient will score at least ***% on FOTO to signify clinically meaningful improvement in functional abilities.   Baseline:  Goal status: INITIAL  3.  Patient will increase 30s chair stand reps from *** to *** with/without arms to demonstrate and improved functional ability with less pain/difficulty as well as reduce fall risk.  Baseline:  Goal status: INITIAL  4.  *** Baseline:  Goal status: INITIAL  5.  *** Baseline:  Goal status: INITIAL  6.  *** Baseline:  Goal status: INITIAL  PLAN:  PT FREQUENCY: 1-2x/week  PT DURATION: {rehab duration:25117}  PLANNED INTERVENTIONS: 97110-Therapeutic exercises, 97530- Therapeutic activity, [id number]- Neuromuscular re-education, 97535- Self Care, 16109- Aquatic Therapy, and Patient/Family education.  PLAN FOR NEXT SESSION: HEP review and update, manual techniques as appropriate, aerobic tasks, ROM and flexibility activities, strengthening and PREs, TPDN, gait and balance training as needed     Eldon Greenland, PT 08/05/2023, 4:18 PM

## 2023-08-04 ENCOUNTER — Encounter: Payer: Self-pay | Admitting: Gastroenterology

## 2023-08-04 ENCOUNTER — Ambulatory Visit (AMBULATORY_SURGERY_CENTER): Admitting: Gastroenterology

## 2023-08-04 VITALS — BP 116/78 | HR 74 | Temp 98.2°F | Resp 22 | Ht 65.0 in | Wt 130.0 lb

## 2023-08-04 DIAGNOSIS — D122 Benign neoplasm of ascending colon: Secondary | ICD-10-CM

## 2023-08-04 DIAGNOSIS — Z1211 Encounter for screening for malignant neoplasm of colon: Secondary | ICD-10-CM | POA: Diagnosis not present

## 2023-08-04 MED ORDER — SODIUM CHLORIDE 0.9 % IV SOLN: 500.0000 mL | Freq: Once | INTRAVENOUS | Status: DC

## 2023-08-04 NOTE — Progress Notes (Signed)
 Pt's states no medical or surgical changes since previsit or office visit.

## 2023-08-04 NOTE — Op Note (Signed)
  [facility] Patient Name: Kristine Baker Procedure Date: 08/04/2023 2:48 PM MRN: [id number] Endoscopist: Ace Abu L. Dominic Friendly , MD, [id number] Age: 48 Referring MD:  Date of Birth: 1975/11/03 Gender: Female Account #: [id number] Procedure:                Colonoscopy Indications:              Screening for colorectal malignant neoplasm, This                            is the patient's first colonoscopy Medicines:                Monitored Anesthesia Care Procedure:                Pre-Anesthesia Assessment:                           - Prior to the procedure, a History and Physical                            was performed, and patient medications and                            allergies were reviewed. The patient's tolerance of                            previous anesthesia was also reviewed. The risks                            and benefits of the procedure and the sedation                            options and risks were discussed with the patient.                            All questions were answered, and informed consent                            was obtained. Prior Anticoagulants: The patient has                            taken no anticoagulant or antiplatelet agents. ASA                            Grade Assessment: I - A normal, healthy patient.                            After reviewing the risks and benefits, the patient                            was deemed in satisfactory condition to undergo the                            procedure.  After obtaining informed consent, the colonoscope                            was passed under direct vision. Throughout the                            procedure, the patient's blood pressure, pulse, and                            oxygen saturations were monitored continuously. The                            Colonoscope was introduced through the anus and                            advanced to the the terminal ileum,  with                            identification of the appendiceal orifice and IC                            valve. The colonoscopy was performed without                            difficulty. The patient tolerated the procedure                            well. The quality of the bowel preparation was                            excellent. The terminal ileum, ileocecal valve,                            appendiceal orifice, and rectum were photographed. Scope In: 3:02:45 PM Scope Out: 3:18:02 PM Scope Withdrawal Time: 0 hours 12 minutes 41 seconds  Total Procedure Duration: 0 hours 15 minutes 17 seconds  Findings:                 The perianal and digital rectal examinations were                            normal.                           The terminal ileum appeared normal.                           Repeat examination of right colon under NBI                            performed.                           A 12 mm polyp was found in the proximal ascending  colon. The polyp was semi-pedunculated. The polyp                            was removed with a hot snare. Resection and                            retrieval were complete. (Removed en bloc, then cut                            with cold snare for retrieval)                           The exam was otherwise without abnormality on                            direct and retroflexion views. Complications:            No immediate complications. Estimated Blood Loss:     Estimated blood loss was minimal. Impression:               - The examined portion of the ileum was normal.                           - One 12 mm polyp in the proximal ascending colon,                            removed with a hot snare. Resected and retrieved.                           - The examination was otherwise normal on direct                            and retroflexion views. Recommendation:           - Patient has a contact number available for                             emergencies. The signs and symptoms of potential                            delayed complications were discussed with the                            patient. Return to normal activities tomorrow.                            Written discharge instructions were provided to the                            patient.                           - Resume previous diet.                           - Continue present  medications.                           - Await pathology results.                           - Repeat colonoscopy is recommended for                            surveillance. The colonoscopy date will be                            determined after pathology results from today's                            exam become available for review. Alizeh Madril L. Dominic Friendly, MD 08/04/2023 3:22:16 PM This report has been signed electronically.

## 2023-08-04 NOTE — Progress Notes (Signed)
 Sedate, gd SR, tolerated procedure well, VSS, report to RN

## 2023-08-04 NOTE — Progress Notes (Signed)
 Called to room to assist during endoscopic procedure.  Patient ID and intended procedure confirmed with present staff. Received instructions for my participation in the procedure from the performing physician.

## 2023-08-04 NOTE — Patient Instructions (Signed)

## 2023-08-05 ENCOUNTER — Ambulatory Visit: Attending: Physical Medicine and Rehabilitation

## 2023-08-05 ENCOUNTER — Telehealth: Payer: Self-pay

## 2023-08-05 NOTE — Telephone Encounter (Signed)
 No answer, left message to call if having any issues or concerns, B.Vale Mousseau RN

## 2023-08-07 ENCOUNTER — Emergency Department (HOSPITAL_BASED_OUTPATIENT_CLINIC_OR_DEPARTMENT_OTHER)
Admission: EM | Admit: 2023-08-07 | Discharge: 2023-08-07 | Disposition: A | Discharge status: Home / Self Care | Attending: Emergency Medicine | Admitting: Emergency Medicine

## 2023-08-07 ENCOUNTER — Other Ambulatory Visit: Payer: Self-pay

## 2023-08-07 DIAGNOSIS — M545 Low back pain, unspecified: Secondary | ICD-10-CM | POA: Insufficient documentation

## 2023-08-07 DIAGNOSIS — G8929 Other chronic pain: Secondary | ICD-10-CM | POA: Insufficient documentation

## 2023-08-07 MED ORDER — OXYCODONE-ACETAMINOPHEN 5-325 MG PO TABS
1.0000 | ORAL_TABLET | Freq: Once | ORAL | Status: AC
  Administered 2023-08-07: 1 via ORAL
  Filled 2023-08-07: qty 1

## 2023-08-07 MED ORDER — METHYLPREDNISOLONE 4 MG PO TBPK: ORAL_TABLET | ORAL | 0 refills | Status: AC

## 2023-08-07 MED ORDER — OXYCODONE-ACETAMINOPHEN 5-325 MG PO TABS: 1.0000 | ORAL_TABLET | Freq: Four times a day (QID) | ORAL | 0 refills | Status: DC | PRN

## 2023-08-07 NOTE — ED Triage Notes (Signed)
 C/o chronic lower back pain x 3 months. Been going to chiropractor. States painful to walk.

## 2023-08-07 NOTE — ED Notes (Signed)
Reviewed discharge instructions, medications, and home care with pt. Pt verbalized understanding and had no further questions. Pt exited ED without complications.

## 2023-08-07 NOTE — Discharge Instructions (Addendum)
 You were seen in the emergency department today for concerns of back pain.  Given the duration of your symptoms, you would benefit from MRI imaging which has been ordered by your orthopedic team and can be performed at any time in an outpatient setting.  I have sent you home with a prescription for methylprednisolone, a steroid that you should take for the next week as prescribed.  A short supply of Percocet has been sent to his pharmacy for pain management.  Please plan on following up with your outpatient team.  I have provided you with the contact formation for [facility] and you can schedule an outpatient evaluation with them as you may benefit from their services.

## 2023-08-07 NOTE — ED Provider Notes (Signed)
 [place] EMERGENCY DEPARTMENT AT [facility] Provider Note   CSN: [id number] Arrival date & time: 08/07/23  1226     Patient presents with: Back Pain   Darsi E Rashid is a 48 y.o. female.  Patient with history of chronic low back pain presents the emergency department today with concerns of back pain.  She reports this pain is been ongoing for about 3 months following a chiropractic adjustment in March 2025.  Reports that it is painful to walk and with almost any sort of movement involving her lower back.  Having a difficult time getting comfortable in bed when trying to sleep.  Denies any urinary symptoms.  No loss of bowel or bladder control, unilateral tingling or numbness, or saddle paresthesia.   Back Pain      Prior to Admission medications   Medication Sig Start Date End Date Taking? Authorizing Provider  methylPREDNISolone (MEDROL DOSEPAK) 4 MG TBPK tablet Take 6 tablets (24 mg total) by mouth daily for 1 day, THEN 5 tablets (20 mg total) daily for 1 day, THEN 4 tablets (16 mg total) daily for 1 day, THEN 3 tablets (12 mg total) daily for 1 day, THEN 2 tablets (8 mg total) daily for 1 day, THEN 1 tablet (4 mg total) daily for 1 day. 08/07/23 08/13/23 Yes Zaynab Chipman A, PA-C  oxyCODONE-acetaminophen  (PERCOCET/ROXICET) 5-325 MG tablet Take 1 tablet by mouth every 6 (six) hours as needed for severe pain (pain score 7-10). 08/07/23  Yes Jailynn Lavalais A, PA-C  cyclobenzaprine  (FLEXERIL ) 10 MG tablet Take 1-2 tablets (10-20 mg total) by mouth 2 (two) times daily as needed for muscle spasms (Back pain. Can cause drowsiness.). 07/02/23   Versa Gore, NP  levonorgestrel  (MIRENA ) 20 MCG/DAY IUD 1 each by Intrauterine route once.    [provider]  lidocaine  (LIDODERM ) 5 % Place 1 patch onto the skin daily. Remove & Discard patch within 12 hours or as directed by MD 07/08/23   Versa Gore, NP  naproxen  (NAPROSYN ) 500 MG tablet Take 1 tablet (500 mg total) by  mouth 2 (two) times daily with a meal. 07/02/23   Versa Gore, NP  predniSONE  (DELTASONE ) 10 MG tablet Take with breakfast:  6 tab day 1, 5 tab day 2-3, 4 tab day 4-5, 3 tab day 6, 2 tab day 7 07/09/23   Versa Gore, NP    Allergies: Patient has no known allergies.    Review of Systems  Musculoskeletal:  Positive for back pain.  All other systems reviewed and are negative.   Updated Vital Signs BP 122/86 (BP Location: Right Arm)   Pulse 83   Temp 98.2 F (36.8 C)   Resp 18   SpO2 99%   Physical Exam Vitals and nursing note reviewed.  Constitutional:      General: She is not in acute distress.    Appearance: She is well-developed.  HENT:     Head: Normocephalic and atraumatic.   Eyes:     Conjunctiva/sclera: Conjunctivae normal.    Cardiovascular:     Rate and Rhythm: Normal rate and regular rhythm.     Heart sounds: No murmur heard. Pulmonary:     Effort: Pulmonary effort is normal. No respiratory distress.     Breath sounds: Normal breath sounds.  Abdominal:     Palpations: Abdomen is soft.     Tenderness: There is no abdominal tenderness.   Musculoskeletal:        General: No swelling.  Cervical back: Neck supple.     Comments: Positive straight leg raise. Pain worsens with axial loading. Any movement worsens pain. No visible clonus.   Skin:    General: Skin is warm and dry.     Capillary Refill: Capillary refill takes less than 2 seconds.   Neurological:     Mental Status: She is alert.     Deep Tendon Reflexes:     Reflex Scores:      Patellar reflexes are 2+ on the right side and 2+ on the left side.      Achilles reflexes are 2+ on the right side and 2+ on the left side.  Psychiatric:        Mood and Affect: Mood normal.     (all labs ordered are listed, but only abnormal results are displayed) Labs Reviewed - No data to display  EKG: None  Radiology: No results found.  Procedures   Medications Ordered in the ED   oxyCODONE-acetaminophen  (PERCOCET/ROXICET) 5-325 MG per tablet 1 tablet (1 tablet Oral Given 08/07/23 1505)                                  Medical Decision Making Risk Prescription drug management.   This patient presents to the ED for concern of back pain.  Differential diagnosis includes chronic low back pain, cauda equina syndrome, radiculopathy, bursitis, discitis   Medicines ordered and prescription drug management:  I ordered medication including Percocet for pain Reevaluation of the patient after these medicines showed that the patient improved I have reviewed the patients home medicines and have made adjustments as needed   Problem List / ED Course:  Patient presents to the emergency department today with concerns of low back pain.  Reports has been ongoing for about 3 months.  She reports that she has been seen by orthopedics but has had no significant proving over the last several months.  She states that her symptoms initially developed after a chiropractic adjustment about 3 months ago.  At this time, she reports that the pain has worsened and she is having worsening mobility primarily due to the pain but denies any new neurologic findings such as bowel or bladder incontinence, saddle paresthesia, or unilateral weakness or numbness.  She does report occasional episodes of tingling along the posterior aspect of bilateral thighs.  She states that she has been having more frequent falls due to worsening pain resulting in ambulation becoming more difficult. On exam, patient has positive straight leg raise bilaterally.  She is rather stiff making full testing of joints in the extremities difficult. Given that this is not an acute onset problem with no reported injury recently, do not feel that patient would benefit from emergent imaging.  Unfortunately no MRI capabilities are available here at our emergency department today as patient is a candidate given prolonged course of  symptoms lasting greater than 6 weeks with noninvasive treatments attempted including muscle relaxants, oral steroids, and physical therapy.  Advised patient that she would benefit from MRI imaging which has already been ordered by her orthopedic team and that she can have this performed at any time through outpatient imaging.  She verbalized understanding.  I am discharging patient home with a Medrol Dosepak in attempt to help control her symptoms and provide some level of relief.  Given no significant red flag symptoms at this time, doubtful of cauda equina syndrome, bursitis, or discitis.  Vitals unremarkable with no fever or tachycardia seen so doubtful of infectious etiology.  Will discharge home with plans for outpatient imaging and close follow-up with orthopedics.  Provided patient with contact information for neurosurgery given minimal success with conservative management as she may ultimately require more invasive therapies.  Final diagnoses:  Chronic bilateral low back pain without sciatica    ED Discharge Orders          Ordered    methylPREDNISolone (MEDROL DOSEPAK) 4 MG TBPK tablet  Daily        08/07/23 1458    oxyCODONE-acetaminophen  (PERCOCET/ROXICET) 5-325 MG tablet  Every 6 hours PRN        08/07/23 1513               Agripina Guyette A, PA-C 08/07/23 1558    Rosealee Concha, MD 08/09/23 1017

## 2023-08-08 ENCOUNTER — Emergency Department (HOSPITAL_COMMUNITY)

## 2023-08-08 ENCOUNTER — Other Ambulatory Visit: Payer: Self-pay

## 2023-08-08 ENCOUNTER — Emergency Department (HOSPITAL_COMMUNITY)
Admission: EM | Admit: 2023-08-08 | Discharge: 2023-08-08 | Disposition: A | Discharge status: Home / Self Care | Attending: Emergency Medicine | Admitting: Emergency Medicine

## 2023-08-08 ENCOUNTER — Encounter (HOSPITAL_COMMUNITY): Payer: Self-pay

## 2023-08-08 DIAGNOSIS — M545 Low back pain, unspecified: Secondary | ICD-10-CM | POA: Diagnosis present

## 2023-08-08 NOTE — ED Triage Notes (Addendum)
 Pt c/o back pain that started 3 months ago after going to chiropractor. Pt has numbness and tingling in both legs. Pt states she is having difficulty walking and has fallen. Pt denies bowel/bladder incontinence. Pt seen for similar symptoms recently.  Pt states she was told to come to ED by provider yesterday to get MRI.

## 2023-08-08 NOTE — Discharge Instructions (Addendum)
 Your MRi shows that you have bulging disc at 2 levels in your spine.  See your primary care provider for recheck.  The prednisone  you were prescribed may help with symptoms.

## 2023-08-09 LAB — SURGICAL PATHOLOGY

## 2023-08-10 NOTE — ED Provider Notes (Signed)
 [place] EMERGENCY DEPARTMENT AT [facility] Provider Note   CSN: [id number] Arrival date & time: 08/08/23  4098     Patient presents with: Back Pain   Kristine Baker is a 48 y.o. female.   Pt complains of low back pain patient was seen yesterday and advised that she would benefit from an MRI.  Patient reports that she has began having difficulty walking because of the pain in her low back.  She has numbness in her leg.  Patient denies any other area of pain.  Patient reports she has not had any loss of bowel or bladder control she has not had any saddle paresthesias.  Patient reports that the pain has become increasingly more intense.  Patient feels like she is having trouble moving her legs.  The history is provided by the patient. No language interpreter was used.  Back Pain      Prior to Admission medications   Medication Sig Start Date End Date Taking? Authorizing Provider  cyclobenzaprine  (FLEXERIL ) 10 MG tablet Take 1-2 tablets (10-20 mg total) by mouth 2 (two) times daily as needed for muscle spasms (Back pain. Can cause drowsiness.). 07/02/23   Versa Gore, NP  levonorgestrel  (MIRENA ) 20 MCG/DAY IUD 1 each by Intrauterine route once.    [provider]  lidocaine  (LIDODERM ) 5 % Place 1 patch onto the skin daily. Remove & Discard patch within 12 hours or as directed by MD 07/08/23   Versa Gore, NP  methylPREDNISolone (MEDROL DOSEPAK) 4 MG TBPK tablet Take 6 tablets (24 mg total) by mouth daily for 1 day, THEN 5 tablets (20 mg total) daily for 1 day, THEN 4 tablets (16 mg total) daily for 1 day, THEN 3 tablets (12 mg total) daily for 1 day, THEN 2 tablets (8 mg total) daily for 1 day, THEN 1 tablet (4 mg total) daily for 1 day. 08/07/23 08/13/23  Zelaya, Oscar A, PA-C  naproxen  (NAPROSYN ) 500 MG tablet Take 1 tablet (500 mg total) by mouth 2 (two) times daily with a meal. 07/02/23   Hudnell, Trevor Fudge, NP  oxyCODONE-acetaminophen  (PERCOCET/ROXICET)  5-325 MG tablet Take 1 tablet by mouth every 6 (six) hours as needed for severe pain (pain score 7-10). 08/07/23   Zelaya, Oscar A, PA-C  predniSONE  (DELTASONE ) 10 MG tablet Take with breakfast:  6 tab day 1, 5 tab day 2-3, 4 tab day 4-5, 3 tab day 6, 2 tab day 7 07/09/23   Versa Gore, NP    Allergies: Patient has no known allergies.    Review of Systems  Musculoskeletal:  Positive for back pain.  All other systems reviewed and are negative.   Updated Vital Signs BP (!) 156/108 (BP Location: Right Arm)   Pulse (!) 111   Temp 98.3 F (36.8 C)   Resp 20   Ht 5' 5 (1.651 m)   Wt 56.7 kg   SpO2 97%   BMI 20.80 kg/m   Physical Exam Vitals and nursing note reviewed.  Constitutional:      Appearance: She is well-developed.  HENT:     Head: Normocephalic.   Cardiovascular:     Rate and Rhythm: Normal rate.  Pulmonary:     Effort: Pulmonary effort is normal.  Abdominal:     General: There is no distension.   Musculoskeletal:        General: Normal range of motion.     Cervical back: Normal range of motion.   Skin:    General: Skin  is warm.   Neurological:     General: No focal deficit present.     Mental Status: She is alert and oriented to person, place, and time.   Psychiatric:        Mood and Affect: Mood normal.     (all labs ordered are listed, but only abnormal results are displayed) Labs Reviewed - No data to display  EKG: None  Radiology: MR LUMBAR SPINE WO CONTRAST Result Date: 08/08/2023 EXAM: MRI LUMBAR SPINE 08/08/2023 11:01:00 AM TECHNIQUE: Multiplanar multisequence MRI of the lumbar spine was performed without the administration of intravenous contrast. COMPARISON: Lumbar spine radiographs 05/22/2023. CLINICAL HISTORY: Lumbar radiculopathy, symptoms persist with > 6 wks treatment. FINDINGS: BONES AND ALIGNMENT: L5 is partially sacralized. Normal vertebral body heights. Bone marrow signal is unremarkable. SPINAL CORD: Conus medullaris terminates  at L1-L2, within normal limits. SOFT TISSUES: No paraspinal mass. L1-L2: No significant disc herniation. No spinal canal stenosis or neural foraminal narrowing. L2-L3: No significant disc herniation. No spinal canal stenosis or neural foraminal narrowing. L3-L4: A broad-based disc protrusion extends into the foramina bilaterally without focal stenosis. L4-L5: A broad-based disc protrusion is present. Mild foraminal stenosis is present bilaterally. L5-S1: No significant disc herniation. No spinal canal stenosis or neural foraminal narrowing. IMPRESSION: 1. Broad-based disc protrusion at L3-4 extending into the foramina bilaterally without focal stenosis. 2. Broad-based disc protrusion at L4-5 with mild foraminal stenosis bilaterally. Electronically signed by: Audree Leas MD 08/08/2023 11:07 AM EDT RP Workstation: [id number]     Procedures   Medications Ordered in the ED - No data to display                                  Medical Decision Making Patient complains of low back pain.  She was seen yesterday and advised that she would benefit from an MRI  Amount and/or Complexity of Data Reviewed Radiology: ordered and independent interpretation performed. Decision-making details documented in ED Course.    Details: MRI ordered reviewed and interpreted patient has a broad based disc protrusion at L3-L4 and L4-L5.  The protrusion at L3-L4 extends into the foraminal bilaterally.  Risk Risk Details: Patient is counseled on the results of her MRI.  Patient is given information on follow-up with neurosurgery.  Patient is advised to follow-up with her primary care physician for recheck.  She is discharged in stable condition.        Final diagnoses:  Acute midline low back pain without sciatica    ED Discharge Orders     None      An After Visit Summary was printed and given to the patient.     Sandi Crosby, PA-C 08/10/23 6301    Merdis Stalling, MD 08/10/23 2038

## 2023-08-11 ENCOUNTER — Ambulatory Visit: Payer: Self-pay | Admitting: Gastroenterology

## 2023-08-16 ENCOUNTER — Ambulatory Visit: Payer: Self-pay

## 2023-08-16 NOTE — Telephone Encounter (Signed)
 FYI Only or Action Required?: FYI only for provider.  Patient was last seen in primary care on 07/08/2023 by Lucius Krabbe, NP. Called Nurse Triage reporting Back Pain. Symptoms began a week ago. Interventions attempted: Prescription medications: Percocet, Naproxen , Prednisone  and Rest, hydration, or home remedies. Symptoms are: unchanged.  Triage Disposition: See PCP When Office is Open (Within 3 Days)  Patient/caregiver understands and will follow disposition?: Yes  Copied from CRM [id number]. Topic: Clinical - Red Word Triage >> Aug 16, 2023  4:32 PM Fonda T wrote: Red Word that prompted transfer to Nurse Triage: Patient calling states she is getting worse with symptom Back pain, dizziness, difficulty walking, joint stiffness. Reason for Disposition  [1] MODERATE back pain (e.g., interferes with normal activities) AND [2] present > 3 days  Answer Assessment - Initial Assessment Questions 1. ONSET: When did the pain begin?      Back pain initially started back in march-pain increased over the last week. 2. LOCATION: Where does it hurt? (upper, mid or lower back)     Lower back  3. SEVERITY: How bad is the pain?  (e.g., Scale 1-10; mild, moderate, or severe)   - MILD (1-3): Doesn't interfere with normal activities.    - MODERATE (4-7): Interferes with normal activities or awakens from sleep.    - SEVERE (8-10): Excruciating pain, unable to do any normal activities.      9 out of 10 4. PATTERN: Is the pain constant? (e.g., yes, no; constant, intermittent)      constant 5. RADIATION: Does the pain shoot into your legs or somewhere else?     Shoots into legs 6. CAUSE:  What do you think is causing the back pain?      unsure 7. BACK OVERUSE:  Any recent lifting of heavy objects, strenuous work or exercise?     no 8. MEDICINES: What have you taken so far for the pain? (e.g., nothing, acetaminophen , NSAIDS)     Percocet 9. NEUROLOGIC SYMPTOMS: Do you have any weakness,  numbness, or problems with bowel/bladder control?     no 10. OTHER SYMPTOMS: Do you have any other symptoms? (e.g., fever, abdomen pain, burning with urination, blood in urine)       no 11. PREGNANCY: Is there any chance you are pregnant? When was your last menstrual period?       no  Protocols used: Back Pain-A-AH

## 2023-08-18 ENCOUNTER — Ambulatory Visit: Admitting: Physical Medicine and Rehabilitation

## 2023-08-18 ENCOUNTER — Inpatient Hospital Stay: Admitting: Family

## 2023-08-18 ENCOUNTER — Encounter: Payer: Self-pay | Admitting: Physical Medicine and Rehabilitation

## 2023-08-18 ENCOUNTER — Telehealth: Payer: Self-pay

## 2023-08-18 DIAGNOSIS — G8929 Other chronic pain: Secondary | ICD-10-CM

## 2023-08-18 DIAGNOSIS — M5441 Lumbago with sciatica, right side: Secondary | ICD-10-CM

## 2023-08-18 DIAGNOSIS — M7918 Myalgia, other site: Secondary | ICD-10-CM | POA: Diagnosis not present

## 2023-08-18 DIAGNOSIS — M5442 Lumbago with sciatica, left side: Secondary | ICD-10-CM

## 2023-08-18 NOTE — Progress Notes (Signed)
 Pain Scale   Average Pain 10 Patient advising she has been having severe lower back pain x 3 months with no relief and states the pain radiates to both legs.        +Driver, -BT, -Dye Allergies.

## 2023-08-18 NOTE — Telephone Encounter (Signed)
 Called patient to let her know to follow up with Ortho due to back pain ne already being referred there. Patient didn't answer, voicemail was left to call office back.

## 2023-08-18 NOTE — Progress Notes (Signed)
 [name] - 48 y.o. female MRN [id number]  Date of birth: [birth year]  Office Visit Note: Visit Date: 08/18/2023 PCP: Lucius Krabbe, NP Referred by: Lucius Krabbe, NP  Subjective: Chief Complaint  Patient presents with   Lower Back - Pain   HPI: Kristine Baker is a 48 y.o. female who comes in today for evaluation of chronic, worsening and severe bilateral lower back pain radiating down both legs. Pain started about several months ago after chiropractic adjustments. She denies lower back pain prior to chiropractic adjustment. States she is not able to function at this point, has pain with activity. She is wearing lumbar brace upon arrival to office today. Her pain worsens with activity and movement. She describes pain as sore and spasm sensation, currently rates as 7 out of 10. Some relief of pain with home exercise regimen, rest and use of medications. She has tried multiple medications such as Flexeril , Lidocaine  patches, Naproxen , Percocet and oral Prednisone  with minimal relief of pain. She is also being treated at [facility] with minimal relief of pain. History of chiropractic treatments that caused increased pain. Her pain increased several weeks ago and was evaluated in the emergency room where she underwent lumbar MRI imaging. Recent lumbar MRI imaging shows increased lordosis, broad-based disc protrusion at L3-4 extending into the foramina bilaterally without focal stenosis. Broad-based disc protrusion at L4-5 with mild foraminal stenosis bilaterally. No high grade spinal canal stenosis noted. Patient denies focal weakness, numbness and tingling. No recent trauma or falls.       Review of Systems  Musculoskeletal:  Positive for back pain and myalgias.  Neurological:  Negative for tingling, sensory change, focal weakness and weakness.  All other systems reviewed and are negative.  Otherwise per HPI.  Assessment & Plan: Visit Diagnoses:    ICD-10-CM   1. Chronic  bilateral low back pain with bilateral sciatica  M54.42 Ambulatory referral to Physical Therapy   M54.41 Ambulatory referral to Orthopedic Surgery   G89.29     2. Myofascial pain syndrome  M79.18 Ambulatory referral to Physical Therapy    Ambulatory referral to Orthopedic Surgery       Plan: Findings:  Chronic, worsening and severe bilateral lower back pain radiating down both legs. Patient continues to have severe pain despite good conservative therapies such as chiropractic treatments, stretching, home exercise regimen, rest and use of medications. I discussed recent lumbar MRI with her today using imaging and spine model. No severe nerve impingement noted, no high grade spinal canal stenosis noted. There is increased lumbar lordosis noted. Patients clinical presentation and exam are complex, differentials include myofascial pain syndrome. Her pain does seem to be more non dermatomal in nature. There is myofascial tenderness noted to bilateral lumbar paraspinal regions. She is extremely tender upon light touch today. Her exam was difficult today due to decreased effort, likely due to pain. She has no focal weakness, good strength to bilateral lower extremities. No myelopathic symptoms noted. We discussed treatment plan in detail today. I place referral for short course of formal physical therapy with a focus on core strengthening and possible dry needling. I also placed referral to our spine surgeon Dr. Ozell Ada for evaluation. Patient has no questions at this time. No red flag symptoms noted upon exam today.     Meds & Orders: No orders of the defined types were placed in this encounter.   Orders Placed This Encounter  Procedures   Ambulatory referral to Physical Therapy  Ambulatory referral to Orthopedic Surgery    Follow-up: Return if symptoms worsen or fail to improve.   Procedures: No procedures performed      Clinical History: EXAM: MRI LUMBAR SPINE 08/08/2023 11:01:00 AM    TECHNIQUE: Multiplanar multisequence MRI of the lumbar spine was performed without the administration of intravenous contrast.   COMPARISON: Lumbar spine radiographs 05/22/2023.   CLINICAL HISTORY: Lumbar radiculopathy, symptoms persist with > 6 wks treatment.   FINDINGS:   BONES AND ALIGNMENT: L5 is partially sacralized. Normal vertebral body heights. Bone marrow signal is unremarkable.   SPINAL CORD: Conus medullaris terminates at L1-L2, within normal limits.   SOFT TISSUES: No paraspinal mass.   L1-L2: No significant disc herniation. No spinal canal stenosis or neural foraminal narrowing.   L2-L3: No significant disc herniation. No spinal canal stenosis or neural foraminal narrowing.   L3-L4: A broad-based disc protrusion extends into the foramina bilaterally without focal stenosis.   L4-L5: A broad-based disc protrusion is present. Mild foraminal stenosis is present bilaterally.   L5-S1: No significant disc herniation. No spinal canal stenosis or neural foraminal narrowing.   IMPRESSION: 1. Broad-based disc protrusion at L3-4 extending into the foramina bilaterally without focal stenosis. 2. Broad-based disc protrusion at L4-5 with mild foraminal stenosis bilaterally.   Electronically signed by: Lonni Necessary MD 08/08/2023 11:07 AM EDT RP Workstation: [id number]   She reports that she has never smoked. She has never used smokeless tobacco. No results for input(s): HGBA1C, LABURIC in the last 8760 hours.  Objective:  VS:  HT:    WT:   BMI:     BP:   HR: bpm  TEMP: ( )  RESP:  Physical Exam Vitals and nursing note reviewed.  HENT:     Head: Normocephalic and atraumatic.     Right Ear: External ear normal.     Left Ear: External ear normal.     Nose: Nose normal.     Mouth/Throat:     Mouth: Mucous membranes are moist.   Eyes:     Extraocular Movements: Extraocular movements intact.    Cardiovascular:     Rate and Rhythm: Normal  rate.     Pulses: Normal pulses.  Pulmonary:     Effort: Pulmonary effort is normal.  Abdominal:     General: Abdomen is flat. There is no distension.   Musculoskeletal:        General: Tenderness present.     Cervical back: Normal range of motion.     Comments: Today's exam today was difficult due to decreased effort, likely due to pain. Patient is slow to rise from seated position to standing. Good lumbar range of motion. No pain noted with facet loading. 5/5 strength noted with bilateral hip flexion, knee flexion/extension, ankle dorsiflexion/plantarflexion and EHL. No clonus noted bilaterally. No pain upon palpation of greater trochanters. No pain with internal/external rotation of bilateral hips. Sensation intact bilaterally. Myofascial tenderness noted to bilateral lumbar paraspinal regions. Negative slump test bilaterally. Ambulates without aid, gait steady.      Skin:    General: Skin is warm and dry.     Capillary Refill: Capillary refill takes less than 2 seconds.   Neurological:     General: No focal deficit present.     Mental Status: She is alert and oriented to person, place, and time.   Psychiatric:        Mood and Affect: Mood normal.        Behavior: Behavior normal.  Ortho Exam  Imaging: No results found.  Past Medical/Family/Surgical/Social History: Medications & Allergies reviewed per EMR, new medications updated. There are no active problems to display for this patient.  No past medical history on file. Family History  Problem Relation Age of Onset   COPD Mother    Stroke Mother    Anxiety disorder Mother    Hypertension Father    Early death Sister    Early death Son    Colon cancer Neg Hx    Rectal cancer Neg Hx    Stomach cancer Neg Hx    Esophageal cancer Neg Hx    No past surgical history on file. Social History   Occupational History   Not on file  Tobacco Use   Smoking status: Never   Smokeless tobacco: Never  Vaping Use    Vaping status: Not on file  Substance and Sexual Activity   Alcohol use: No   Drug use: No   Sexual activity: Yes    Birth control/protection: Condom, I.U.D.

## 2023-08-18 NOTE — Telephone Encounter (Signed)
 Noted. Trying to reach patient to advise f/u with Ortho (has recently seen).

## 2023-08-26 ENCOUNTER — Other Ambulatory Visit: Payer: Self-pay

## 2023-08-26 ENCOUNTER — Encounter (HOSPITAL_COMMUNITY): Payer: Self-pay | Admitting: *Deleted

## 2023-08-26 ENCOUNTER — Emergency Department (HOSPITAL_COMMUNITY)
Admission: EM | Admit: 2023-08-26 | Discharge: 2023-08-26 | Disposition: A | Discharge status: Home / Self Care | Payer: Self-pay

## 2023-08-26 DIAGNOSIS — M5442 Lumbago with sciatica, left side: Secondary | ICD-10-CM | POA: Insufficient documentation

## 2023-08-26 DIAGNOSIS — M5441 Lumbago with sciatica, right side: Secondary | ICD-10-CM | POA: Insufficient documentation

## 2023-08-26 DIAGNOSIS — G8929 Other chronic pain: Secondary | ICD-10-CM | POA: Insufficient documentation

## 2023-08-26 LAB — CBC WITH DIFFERENTIAL/PLATELET
Abs Immature Granulocytes: 0.01 10*3/uL (ref 0.00–0.07)
Basophils Absolute: 0.1 10*3/uL (ref 0.0–0.1)
Basophils Relative: 1 %
Eosinophils Absolute: 0.3 10*3/uL (ref 0.0–0.5)
Eosinophils Relative: 6 %
HCT: 37.6 % (ref 36.0–46.0)
Hemoglobin: 13.3 g/dL (ref 12.0–15.0)
Immature Granulocytes: 0 %
Lymphocytes Relative: 46 %
Lymphs Abs: 2.5 10*3/uL (ref 0.7–4.0)
MCH: 28.5 pg (ref 26.0–34.0)
MCHC: 35.4 g/dL (ref 30.0–36.0)
MCV: 80.5 fL (ref 80.0–100.0)
Monocytes Absolute: 0.4 10*3/uL (ref 0.1–1.0)
Monocytes Relative: 8 %
Neutro Abs: 2.1 10*3/uL (ref 1.7–7.7)
Neutrophils Relative %: 39 %
Platelets: 275 10*3/uL (ref 150–400)
RBC: 4.67 MIL/uL (ref 3.87–5.11)
RDW: 13.9 % (ref 11.5–15.5)
WBC: 5.3 10*3/uL (ref 4.0–10.5)
nRBC: 0 % (ref 0.0–0.2)

## 2023-08-26 LAB — BASIC METABOLIC PANEL WITH GFR
Anion gap: 5 (ref 5–15)
BUN: 13 mg/dL (ref 6–20)
CO2: 23 mmol/L (ref 22–32)
Calcium: 8.7 mg/dL — ABNORMAL LOW (ref 8.9–10.3)
Chloride: 110 mmol/L (ref 98–111)
Creatinine, Ser: 0.75 mg/dL (ref 0.44–1.00)
GFR, Estimated: >60 mL/min (ref >60)
Glucose, Bld: 103 mg/dL — ABNORMAL HIGH (ref 70–99)
Potassium: 4.1 mmol/L (ref 3.5–5.1)
Sodium: 138 mmol/L (ref 135–145)

## 2023-08-26 LAB — HCG, SERUM, QUALITATIVE: Preg, Serum: NEGATIVE

## 2023-08-26 MED ORDER — DEXAMETHASONE SODIUM PHOSPHATE 10 MG/ML IJ SOLN
10.0000 mg | Freq: Once | INTRAMUSCULAR | Status: AC
  Administered 2023-08-26: 10 mg via INTRAVENOUS
  Filled 2023-08-26: qty 1

## 2023-08-26 MED ORDER — METHOCARBAMOL 500 MG PO TABS
1000.0000 mg | ORAL_TABLET | Freq: Once | ORAL | Status: AC
  Administered 2023-08-26: 1000 mg via ORAL
  Filled 2023-08-26: qty 2

## 2023-08-26 MED ORDER — KETOROLAC TROMETHAMINE 15 MG/ML IJ SOLN
15.0000 mg | Freq: Once | INTRAMUSCULAR | Status: AC
Start: 2023-08-26 — End: 2023-08-26
  Administered 2023-08-26: 15 mg via INTRAVENOUS
  Filled 2023-08-26: qty 1

## 2023-08-26 MED ORDER — METHOCARBAMOL 500 MG PO TABS: 500.0000 mg | ORAL_TABLET | Freq: Two times a day (BID) | ORAL | 0 refills | Status: DC

## 2023-08-26 MED ORDER — MORPHINE SULFATE (PF) 4 MG/ML IV SOLN
4.0000 mg | Freq: Once | INTRAVENOUS | Status: DC
  Filled 2023-08-26: qty 1

## 2023-08-26 MED ORDER — MELOXICAM 15 MG PO TABS: 15.0000 mg | ORAL_TABLET | Freq: Every day | ORAL | 0 refills | Status: DC

## 2023-08-26 MED ORDER — MORPHINE SULFATE (PF) 4 MG/ML IV SOLN
4.0000 mg | Freq: Once | INTRAVENOUS | Status: AC
  Administered 2023-08-26: 4 mg via INTRAVENOUS
  Filled 2023-08-26: qty 1

## 2023-08-26 NOTE — ED Triage Notes (Signed)
 Pt has been having having increasing lower back pain since she was adjusted by chiropractor 3/17.  Pt states that recently she has fallen, no incontinence.

## 2023-08-26 NOTE — ED Triage Notes (Signed)
 PT arrives via POV. PT reports ongoing lower back pain for the past 4 months. States pain occurred after going to a chiropractor. No new injury. Reports ongoing numbness and tingling as well to both legs. PT AxOx4.

## 2023-08-26 NOTE — ED Notes (Signed)
 Attempted to get patient up to ambulate but patient refused dtating she will fall and cannot stand. PA Gilliam advised.

## 2023-08-26 NOTE — ED Notes (Signed)
 Pt has had xray and MRI and was referred to neuro surgery (appointment is 9/5)

## 2023-08-26 NOTE — ED Notes (Signed)
IV access attempted X2 without success.

## 2023-08-26 NOTE — ED Provider Notes (Signed)
 [facility]PARTMENT AT [facility] Provider Note   CSN: [id number] Arrival date & time: 08/26/23  1213     Patient presents with: Back Pain   Kristine Baker is a 48 y.o. female who presents to the ED today with increased weakness in the bilateral lower extremities with history of lower back pain, previously seen on 6/15 for same, MRI done at that time did not show disc protrusion into L3-L4 along with mild foraminal stenosis L4-L5.  Had previously noted that she did not have any saddle anesthesia on previous previous visits however today does state that she has numbness noted in her groin area.  Also states that she cannot stand or walk without assistance.  Denies any bowel or bladder incontinence nor is there any reports of retention.  Currently takes Naprosyn  for pain which she states does not control her pain well, also has been taking Flexeril  previously with minimal reduction in symptoms.      Back Pain      Prior to Admission medications   Medication Sig Start Date End Date Taking? Authorizing Provider  meloxicam  (MOBIC ) 15 MG tablet Take 1 tablet (15 mg total) by mouth daily. 08/26/23  Yes Myriam Dorn BROCKS, PA  methocarbamol  (ROBAXIN ) 500 MG tablet Take 1 tablet (500 mg total) by mouth 2 (two) times daily. 08/26/23  Yes Myriam Dorn BROCKS, PA  cyclobenzaprine  (FLEXERIL ) 10 MG tablet Take 1-2 tablets (10-20 mg total) by mouth 2 (two) times daily as needed for muscle spasms (Back pain. Can cause drowsiness.). 07/02/23   Lucius Krabbe, NP  levonorgestrel  (MIRENA ) 20 MCG/DAY IUD 1 each by Intrauterine route once.    [provider]  lidocaine  (LIDODERM ) 5 % Place 1 patch onto the skin daily. Remove & Discard patch within 12 hours or as directed by MD 07/08/23   Lucius Krabbe, NP  naproxen  (NAPROSYN ) 500 MG tablet Take 1 tablet (500 mg total) by mouth 2 (two) times daily with a meal. 07/02/23   Lucius Krabbe, NP  oxyCODONE -acetaminophen   (PERCOCET/ROXICET) 5-325 MG tablet Take 1 tablet by mouth every 6 (six) hours as needed for severe pain (pain score 7-10). 08/07/23   Zelaya, Oscar A, PA-C  predniSONE  (DELTASONE ) 10 MG tablet Take with breakfast:  6 tab day 1, 5 tab day 2-3, 4 tab day 4-5, 3 tab day 6, 2 tab day 7 07/09/23   Lucius Krabbe, NP    Allergies: Patient has no known allergies.    Review of Systems  Musculoskeletal:  Positive for back pain.  All other systems reviewed and are negative.   Updated Vital Signs BP (!) 127/95   Pulse 76   Temp 98 F (36.7 C) (Oral)   Resp 16   SpO2 99%   Physical Exam Vitals and nursing note reviewed.  Constitutional:      General: She is not in acute distress.    Appearance: Normal appearance.  HENT:     Head: Normocephalic and atraumatic.     Mouth/Throat:     Mouth: Mucous membranes are moist.     Pharynx: Oropharynx is clear.  Eyes:     Extraocular Movements: Extraocular movements intact.     Conjunctiva/sclera: Conjunctivae normal.     Pupils: Pupils are equal, round, and reactive to light.  Cardiovascular:     Rate and Rhythm: Normal rate and regular rhythm.     Pulses: Normal pulses.     Heart sounds: Normal heart sounds. No murmur heard.  No friction rub. No gallop.  Pulmonary:     Effort: Pulmonary effort is normal.     Breath sounds: Normal breath sounds.  Abdominal:     General: Abdomen is flat. Bowel sounds are normal.     Palpations: Abdomen is soft.  Musculoskeletal:     Cervical back: Normal, normal range of motion and neck supple.     Thoracic back: Normal.     Lumbar back: Tenderness present. Positive right straight leg raise test and positive left straight leg raise test.     Right hip: Normal.     Left hip: Normal.     Right upper leg: Normal.     Left upper leg: Normal.     Right knee: Normal.     Left knee: Normal.     Right lower leg: No edema.     Left lower leg: No edema.     Comments: Lumbar tenderness elicited from below L3   Skin:    General: Skin is warm and dry.     Capillary Refill: Capillary refill takes less than 2 seconds.  Neurological:     General: No focal deficit present.     Mental Status: She is alert and oriented to person, place, and time. Mental status is at baseline.     GCS: GCS eye subscore is 4. GCS verbal subscore is 5. GCS motor subscore is 6.     Sensory: Sensation is intact.     Motor: Motor function is intact.     Comments: Assessment of lower extremities difficult due to patient's discomfort, dorsiflexion and plantarflexion of the foot bilaterally is normal with 5/5 strength.  Psychiatric:        Mood and Affect: Mood normal.     (all labs ordered are listed, but only abnormal results are displayed) Labs Reviewed  BASIC METABOLIC PANEL WITH GFR - Abnormal; Notable for the following components:      Result Value   Glucose, Bld 103 (*)    Calcium 8.7 (*)    All other components within normal limits  HCG, SERUM, QUALITATIVE  CBC WITH DIFFERENTIAL/PLATELET    EKG: None  Radiology: No results found.   Procedures   Medications Ordered in the ED  morphine  (PF) 4 MG/ML injection 4 mg (4 mg Intravenous Given 08/26/23 1738)  dexamethasone  (DECADRON ) injection 10 mg (10 mg Intravenous Given 08/26/23 1738)  ketorolac  (TORADOL ) 15 MG/ML injection 15 mg (15 mg Intravenous Given 08/26/23 2139)  methocarbamol  (ROBAXIN ) tablet 1,000 mg (1,000 mg Oral Given 08/26/23 2139)    Clinical Course as of 08/26/23 2141  Thu Aug 26, 2023  2141 Patient ambulated, cannot ambulate at that time without assistance, unable to properly observe gait as a result. [JG]    Clinical Course User Index [JG] Myriam Dorn BROCKS, PA                                 Medical Decision Making Amount and/or Complexity of Data Reviewed Labs: ordered.  Risk Prescription drug management.   Medical Decision Making:   Kristine Baker is a 48 y.o. female who presented to the ED today with low back pain and lower  extremity weakness detailed above.     Complete initial physical exam performed, notably the patient  was alert and oriented in no apparent distress.  Physical exam as noted, specifically straight leg test is positive bilaterally and the exam of the  lower extremities is limited due to pain with movement..    Reviewed and confirmed nursing documentation for past medical history, family history, social history.    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    Initial Plan:  Administer dexamethasone  4 relief of inflammation as well as morphine  for pain relief. Screening labs including CBC and Metabolic panel to evaluate for infectious or metabolic etiology of disease.  As MR imaging was recently ordered, will not repeat order at this time. Objective evaluation as below reviewed   Initial Study Results:   Laboratory  All laboratory results reviewed without evidence of clinically relevant pathology.   Exceptions include: None  Radiology:  All images reviewed independently. Agree with radiology report at this time.   MR LUMBAR SPINE WO CONTRAST Result Date: 08/08/2023 EXAM: MRI LUMBAR SPINE 08/08/2023 11:01:00 AM TECHNIQUE: Multiplanar multisequence MRI of the lumbar spine was performed without the administration of intravenous contrast. COMPARISON: Lumbar spine radiographs 05/22/2023. CLINICAL HISTORY: Lumbar radiculopathy, symptoms persist with > 6 wks treatment. FINDINGS: BONES AND ALIGNMENT: L5 is partially sacralized. Normal vertebral body heights. Bone marrow signal is unremarkable. SPINAL CORD: Conus medullaris terminates at L1-L2, within normal limits. SOFT TISSUES: No paraspinal mass. L1-L2: No significant disc herniation. No spinal canal stenosis or neural foraminal narrowing. L2-L3: No significant disc herniation. No spinal canal stenosis or neural  foraminal narrowing. L3-L4: A broad-based disc protrusion extends into the foramina bilaterally without focal stenosis. L4-L5: A broad-based disc protrusion is present. Mild foraminal stenosis is present bilaterally. L5-S1: No significant disc herniation. No spinal canal stenosis or neural foraminal narrowing. IMPRESSION: 1. Broad-based disc protrusion at L3-4 extending into the foramina bilaterally without focal stenosis. 2. Broad-based disc protrusion at L4-5 with mild foraminal stenosis bilaterally. Electronically signed by: Lonni Necessary MD 08/08/2023 11:07 AM EDT RP Workstation: [id number]      Consults: Case discussed with Dr. Colon with neurosurgery.   Reassessment and Plan:   After consultation with neurosurgery, and review of previous imaging along with her physical exam today, this patient will be discharged with outpatient course of methocarbamol  and Mobic .  Physical exam findings did not demonstrate any acute changes from previous visits, thus consultation with neurosurgery recommended dose of IV Toradol  followed with outpatient NSAIDs and muscle relaxers.  She is due to have follow-up with physical therapy in the coming week.  Consultation with neurosurgery recommendation was that she did not present as a surgical candidate at this time and that she is to follow-up with her primary care provider and previous neurosurgery team to evaluate her progress.  Based on reevaluation after final medication administration, patient is able to ambulate with minimal assistance, and as she is not a surgical candidate at this time does not meet criteria for admission.  After dosing with ketorolac  and methocarbamol  she states substantial improvement in her discomfort level.  At this time feel patient is stable for discharge.       Final diagnoses:  Chronic midline low back pain with bilateral sciatica    ED Discharge Orders          Ordered    methocarbamol  (ROBAXIN ) 500 MG tablet  2 times  daily        08/26/23 2130    meloxicam  (MOBIC ) 15 MG tablet  Daily        08/26/23 2130  Myriam Dorn BROCKS, PA 08/26/23 2209    Gennaro Bouchard L, DO 08/31/23 1005

## 2023-08-27 NOTE — ED Notes (Signed)
 4 mg Morphine  wasted with Benton BRAVO. RN at [phone].

## 2023-08-27 NOTE — ED Notes (Signed)
 Morphine  4mg  wasted with Carlyon SQUIBB. RN at [phone] am

## 2023-08-31 NOTE — Therapy (Incomplete)
 OUTPATIENT PHYSICAL THERAPY THORACOLUMBAR EVALUATION  Patient Name: Kristine Baker MRN: [id number] DOB:27-Sep-1975, 48 y.o., female Today's Date: 08/31/2023    No past medical history on file. No past surgical history on file. There are no active problems to display for this patient.   PCP: Lucius Krabbe, NP  REFERRING PROVIDER: Trudy Duwaine FORBES, NP  THERAPY DIAG:  No diagnosis found.  REFERRING DIAG: ***  Rationale for Evaluation and Treatment:  Rehabilitation  SUBJECTIVE:  PERTINENT PAST HISTORY:  ***        PRECAUTIONS: {Therapy precautions:24002}  WEIGHT BEARING RESTRICTIONS {Yes ***/No:24003}  FALLS:  Has patient fallen in last 6 months? {yes/no:20286}, Number of falls: ***  MOI/History of condition:  Onset date: ***  SUBJECTIVE STATEMENT  Kristine Baker is a 48 y.o. female who presents to clinic with chief complaint of ***.  ***  From referring provider:  HPI: Kristine Baker is a 48 y.o. female who comes in today for evaluation of chronic, worsening and severe bilateral lower back pain radiating down both legs. Pain started about several months ago after chiropractic adjustments. She denies lower back pain prior to chiropractic adjustment. States she is not able to function at this point, has pain with activity. She is wearing lumbar brace upon arrival to office today. Her pain worsens with activity and movement. She describes pain as sore and spasm sensation, currently rates as 7 out of 10. Some relief of pain with home exercise regimen, rest and use of medications. She has tried multiple medications such as Flexeril , Lidocaine  patches, Naproxen , Percocet and oral Prednisone  with minimal relief of pain. She is also being treated at [facility] with minimal relief of pain. History of chiropractic treatments that caused increased pain. Her pain increased several weeks ago and was evaluated in the emergency room where she underwent lumbar MRI imaging. Recent  lumbar MRI imaging shows increased lordosis, broad-based disc protrusion at L3-4 extending into the foramina bilaterally without focal stenosis. Broad-based disc protrusion at L4-5 with mild foraminal stenosis bilaterally. No high grade spinal canal stenosis noted. Patient denies focal weakness, numbness and tingling. No recent trauma or falls.    Red flags:  {has/denies:26543} {kerredflag:26542}  Pain:  Are you having pain? {yes/no:20286} Pain location: *** NPRS scale:  Average: {NUMBERS; 0-10:5044}/10, Worst: {NUMBERS; 0-10:5044}/10 Aggravating factors: *** Relieving factors: *** Pain description: {PAIN DESCRIPTION:21022940}  Occupation: ***  Assistive Device: ***  Hand Dominance: ***  Patient Goals/Specific Activities: ***   OBJECTIVE:   DIAGNOSTIC FINDINGS:  ***  GENERAL OBSERVATION/GAIT: ***  SENSATION: Light touch: {intact/deficits:24005}  LUMBAR AROM  AROM AROM  (Eval)  Flexion {kerromlxflex:28296}  Extension {kerromcxlx:26716}  Right lateral flexion {kerromcxlx:26716}  Left lateral flexion {kerromcxlx:26716}  Right rotation {kerromcxlx:26716}  Left rotation {kerromcxlx:26716}    (Blank rows = not tested)   LE MMT:  MMT Right (Eval) Left (Eval)  Hip flexion (L2, L3) *** ***  Knee extension (L3) *** ***  Knee flexion    Hip abduction *** ***  Hip extension *** ***  Hip external rotation *** ***  Hip internal rotation *** ***  Hip adduction    Ankle dorsiflexion (L4)    Ankle plantarflexion (S1)    Ankle inversion    Ankle eversion    Great Toe ext (L5)    Grossly     (Blank rows = not tested, score listed is out of 5 possible points.  N = WNL, D = diminished, C = clear for gross weakness with myotome testing, * =  concordant pain with testing)  SPECIAL TESTS:  Straight leg raise: L (***), R (***) Slump: L (***), R (***)  MUSCLE LENGTH: Hamstrings: Right {kerminsig:27227} restriction; Left {kerminsig:27227} restriction Hip flexors: Right  {kerminsig:27227} restriction; Left {kerminsig:27227} restriction  LE ROM:  ROM Right (Eval) Left (Eval)  Hip flexion    Hip extension    Hip abduction    Hip adduction    Hip internal rotation    Hip external rotation    Knee flexion    Knee extension    Ankle dorsiflexion    Ankle plantarflexion    Ankle inversion    Ankle eversion      (Blank rows = not tested, N = WNL, * = concordant pain with testing)  Functional Tests  Eval                                                                PALPATION:   ***  PATIENT SURVEYS:  ODI: ***  TODAY'S TREATMENT  Therapeutic Exercise: Creating, reviewing, and completing below HEP  PATIENT EDUCATION ([place]/HM):  POC, diagnosis, prognosis, HEP, and outcome measures.  Pt educated via explanation, demonstration, and handout (HEP).  Pt confirms understanding verbally.   HOME EXERCISE PROGRAM: ***  Treatment priorities   Eval                                                  ASSESSMENT:  CLINICAL IMPRESSION: Kristine Baker is a 48 y.o. female who presents to clinic with signs and sxs consistent with ***.   ***.   Kristine Baker will benefit from skilled PT to address relevant deficits and improve ***.   OBJECTIVE IMPAIRMENTS: Pain, ***  ACTIVITY LIMITATIONS: ***  PERSONAL FACTORS: See medical history and pertinent history   REHAB POTENTIAL: Good  CLINICAL DECISION MAKING: Evolving/moderate complexity  EVALUATION COMPLEXITY: Moderate   GOALS:   SHORT TERM GOALS: Target date: ***  Kristine Baker will be >75% HEP compliant to improve carryover between sessions and facilitate independent management of condition  Evaluation: ongoing Goal status: INITIAL   LONG TERM GOALS: Target date: ***  Kristine Baker will self report >/= 50% decrease in pain from evaluation to improve function in daily tasks  Evaluation/Baseline: ***/10 max pain Goal status: INITIAL   2.  Kristine Baker will show a >/= *** pt improvement in their ODI  score (MCID is 12% or 6/50 pts) as a proxy for functional improvement   Evaluation/Baseline: *** pts Goal status: INITIAL   3.  Kristine Baker will be able to ***, not limited by pain  Evaluation/Baseline: limited Goal status: INITIAL   4.  ***   5.  ***   6.  ***   PLAN: PT FREQUENCY: 1-2x/week  PT DURATION: 8 weeks  PLANNED INTERVENTIONS:  97164- PT Re-evaluation, 97110-Therapeutic exercises, 97530- Therapeutic activity, [id number]- Neuromuscular re-education, 97535- Self Care, 02859- Manual therapy, [id number]- Gait training, [id number]- Aquatic Therapy, [id number]- Electrical stimulation (manual), [id number]- Vasopneumatic device, [id number]- Traction (mechanical), [id number]- Ionotophoresis 4mg /ml Dexamethasone , Taping, Dry Needling, Joint manipulation, and Spinal manipulation.   Longino Trefz PT, DPT 08/31/2023, 8:42 AM

## 2023-09-01 ENCOUNTER — Ambulatory Visit: Payer: Self-pay | Attending: Physical Medicine and Rehabilitation | Admitting: Physical Therapy

## 2023-09-06 ENCOUNTER — Encounter: Admitting: Orthopedic Surgery

## 2023-09-14 ENCOUNTER — Encounter: Payer: Self-pay | Admitting: Family

## 2023-09-14 ENCOUNTER — Telehealth (INDEPENDENT_AMBULATORY_CARE_PROVIDER_SITE_OTHER): Payer: Self-pay | Admitting: Family

## 2023-09-14 DIAGNOSIS — G8929 Other chronic pain: Secondary | ICD-10-CM

## 2023-09-14 DIAGNOSIS — M5442 Lumbago with sciatica, left side: Secondary | ICD-10-CM

## 2023-09-14 DIAGNOSIS — M5441 Lumbago with sciatica, right side: Secondary | ICD-10-CM

## 2023-09-14 MED ORDER — CYCLOBENZAPRINE HCL 10 MG PO TABS: 10.0000 mg | ORAL_TABLET | Freq: Two times a day (BID) | ORAL | 0 refills | Status: DC | PRN

## 2023-09-14 MED ORDER — PREDNISONE 10 MG PO TABS: ORAL_TABLET | ORAL | 0 refills | Status: DC

## 2023-09-14 NOTE — Progress Notes (Signed)
 MyChart Video Visit    Virtual Visit via Video Note   This format is felt to be most appropriate for this patient at this time. Physical exam was limited by quality of the video and audio technology used for the visit. CMA was able to get the patient set up on a video visit.  Patient location: Home. Patient and provider in visit Provider location: Office  I discussed the limitations of evaluation and management by telemedicine and the availability of in person appointments. The patient expressed understanding and agreed to proceed.  Visit Date: 09/14/2023  Today's healthcare provider: Lucius Krabbe, NP     Subjective:   Patient ID: Kristine Baker, female    DOB: 04-Jun-1975, 48 y.o.   MRN: [id number]  Chief Complaint  Patient presents with   Fall    Pt c/o falls 4-5 a day.  legs get weak - tingling & numbness-  orthopedics - PT -  Discussed the use of AI scribe software for clinical note transcription with the patient, who gave verbal consent to proceed.  History of Present Illness Kristine Baker is a 48 year old female who presents with balance issues and falls.  Gait instability and falls - Frequent falls due to balance issues - Sudden episodes of lower extremity weakness with legs giving out - Falls occur while standing and during activities such as climbing stairs - Requires support from walls to ambulate within her home - Difficulty walking  Lower extremity paresthesia - Tingling and numbness present in both legs  Spinal pathology - MRI performed one month ago demonstrated two bulging discs in the spine - Surgical intervention was not recommended  Pain and sleep disturbance - Significant pain impacting daily function - Difficulty sleeping, with frequent awakenings every hour throughout the night  Functional impairment - Unable to work due to symptoms - In process of obtaining FMLA paperwork for short-term disability  Therapeutic interventions and  response - Current medications include Flexeril , methocarbamol , and prednisone  without symptom relief - Received Toradol  injection with minimal improvement - Attends weekly stretch therapy sessions, paying out of pocket due to insurance delays in accessing physical therapy  Insurance and access to care issues - Insurance issues related to Medicaid family planning coverage and not full coverage - Attempting to resolve insurance issues through her employer to access necessary treatments  Assessment & Plan Chronic lumbar pain w/radiculpathy Now causing gait instability and lower extremity weakness with several recent falls due to balance issues and leg weakness. MRI shows 2 bulging discs at L3-4 & L4-5. Previous evaluations do not indicate surgery, PT recommended but unable to start as of yet. Medications ineffective. Discussed nerve block or spinal injection. Awaiting neurosurgery follow-up. - Attend neurosurgery appointment on August 14th. - Prescribe prednisone , advise morning administration to prevent insomnia, reminded of other possible SE. - Prescribe Flexeril  for symptom relief and to help with sleep. - Encourage resolution of insurance issues to enable physical therapy. - Advise against non-professional stretching that may worsen symptoms.  Insurance and disability paperwork Insurance issues delaying physical therapy. Requires FMLA paperwork for short-term disability due to inability to work. - Assist with FMLA paperwork upon receipt of necessary details. - Advise resolution of insurance issues to access physical therapy.  No past medical history on file.  No past surgical history on file.  Outpatient Medications Prior to Visit  Medication Sig Dispense Refill   cyclobenzaprine  (FLEXERIL ) 10 MG tablet Take 1-2 tablets (10-20 mg total) by mouth 2 (two)  times daily as needed for muscle spasms (Back pain. Can cause drowsiness.). 20 tablet 0   levonorgestrel  (MIRENA ) 20 MCG/DAY IUD 1  each by Intrauterine route once.     lidocaine  (LIDODERM ) 5 % Place 1 patch onto the skin daily. Remove & Discard patch within 12 hours or as directed by MD 30 patch 2   meloxicam  (MOBIC ) 15 MG tablet Take 1 tablet (15 mg total) by mouth daily. 30 tablet 0   methocarbamol  (ROBAXIN ) 500 MG tablet Take 1 tablet (500 mg total) by mouth 2 (two) times daily. 20 tablet 0   naproxen  (NAPROSYN ) 500 MG tablet Take 1 tablet (500 mg total) by mouth 2 (two) times daily with a meal. 30 tablet 0   oxyCODONE -acetaminophen  (PERCOCET/ROXICET) 5-325 MG tablet Take 1 tablet by mouth every 6 (six) hours as needed for severe pain (pain score 7-10). 5 tablet 0   predniSONE  (DELTASONE ) 10 MG tablet Take with breakfast:  6 tab day 1, 5 tab day 2-3, 4 tab day 4-5, 3 tab day 6, 2 tab day 7 31 tablet 0   No facility-administered medications prior to visit.    No Known Allergies     Objective:   Physical Exam Vitals and nursing note reviewed.  Constitutional:      General: Pt is not in acute distress.    Appearance: Normal appearance.  HENT:     Head: Normocephalic.  Pulmonary:     Effort: No respiratory distress.  Musculoskeletal:     Cervical back: Normal range of motion.  Skin:    General: Skin is dry.     Coloration: Skin is not pale.  Neurological:     Mental Status: Pt is alert and oriented to person, place, and time.  Psychiatric:        Mood and Affect: Mood normal.   There were no vitals taken for this visit.  Wt Readings from Last 3 Encounters:  08/08/23 125 lb (56.7 kg)  08/04/23 130 lb (59 kg)  07/14/23 130 lb (59 kg)      I discussed the assessment and treatment plan with the patient. The patient was provided an opportunity to ask questions and all were answered. The patient agreed with the plan and demonstrated an understanding of the instructions.   The patient was advised to call back or seek an in-person evaluation if the symptoms worsen or if the condition fails to improve as  anticipated.  Lucius Krabbe, NP [facility] [phone] (phone) [phone] (fax)  [facility]

## 2023-09-14 NOTE — Assessment & Plan Note (Signed)
 Several ED visits d/t pain. Now causing gait instability and lower extremity weakness with several recent falls due to balance issues and leg weakness. MRI shows 2 bulging discs at L3-4 & L4-5. Previous evaluations do not indicate surgery, PT recommended but unable to start as of yet. Medications ineffective. Discussed nerve block or spinal injection. Awaiting neurosurgery follow-up. - Attend neurosurgery appointment on August 14th. - Prescribe prednisone , advise morning administration to prevent insomnia, reminded of other possible SE. - Prescribe Flexeril  for symptom relief and to help with sleep. - Encourage resolution of insurance issues to enable physical therapy. - Advise against non-professional stretching that may worsen symptoms.

## 2023-09-16 ENCOUNTER — Telehealth: Payer: Self-pay | Admitting: Family

## 2023-09-16 DIAGNOSIS — Z0279 Encounter for issue of other medical certificate: Secondary | ICD-10-CM

## 2023-09-16 NOTE — Telephone Encounter (Signed)
 Received

## 2023-09-16 NOTE — Telephone Encounter (Signed)
 Patient's daughter dropped off document FMLA, to be filled out by provider. Patient requested to send it back via Call Patient to pick up within ASAP. Document is located in providers tray at front office.Please advise at Mobile [phone] (mobile).

## 2023-09-23 DIAGNOSIS — Z0279 Encounter for issue of other medical certificate: Secondary | ICD-10-CM

## 2023-09-23 NOTE — Telephone Encounter (Signed)
 I called pt and LVM in regards to FMLA forms.    FMLA forms are complete and I will place them at the front desk for pick up.

## 2023-10-01 NOTE — Therapy (Signed)
 OUTPATIENT PHYSICAL THERAPY THORACOLUMBAR EVALUATION   Patient Name: Kristine Baker MRN: [id number] DOB:[birth year], 48 y.o., female Today's Date: 10/04/2023  END OF SESSION:  PT End of Session - 10/04/23 0825     Visit Number 1    Number of Visits 6    Date for PT Re-Evaluation 10/04/23    Authorization Type MCD    PT Start Time 0745    PT Stop Time 0825    PT Time Calculation (min) 40 min    Activity Tolerance Patient limited by pain;Other (comment)    Behavior During Therapy Restless;Anxious          History reviewed. No pertinent past medical history. History reviewed. No pertinent surgical history. Patient Active Problem List   Diagnosis Date Noted   Chronic bilateral low back pain with bilateral sciatica 09/14/2023    PCP: Lucius Krabbe, NP PCP - General  REFERRING PROVIDER: Trudy Duwaine FORBES, NP  REFERRING DIAG: M54.42,M54.41,G89.29 (ICD-10-CM) - Chronic bilateral low back pain with bilateral sciatica M79.18 (ICD-10-CM) - Myofascial pain syndrome  Rationale for Evaluation and Treatment: Rehabilitation  THERAPY DIAG:  Unsteadiness on feet  ONSET DATE: 4 months  SUBJECTIVE:                                                                                                                                                                                           SUBJECTIVE STATEMENT: Pain located in lower back. Been having this pain for the past 3 months. Patient says she had went to the chiropractor and says the pain started after that. No PT, does home exercises. She takes Prednisone , eases a little bit. Can't lift leg. Gets real stiff when she lay down, got to roll out of bed. When sleeping, have muscle spasms.    PERTINENT HISTORY:  Chronic lumbar pain w/radiculpathy Now causing gait instability and lower extremity weakness with several recent falls due to balance issues and leg weakness. MRI shows 2 bulging discs at L3-4 & L4-5. Previous evaluations do not  indicate surgery, PT recommended but unable to start as of yet. Medications ineffective. Discussed nerve block or spinal injection. Awaiting neurosurgery follow-up. - Attend neurosurgery appointment on August 14th. - Prescribe prednisone , advise morning administration to prevent insomnia, reminded of other possible SE. - Prescribe Flexeril  for symptom relief and to help with sleep. - Encourage resolution of insurance issues to enable physical therapy. - Advise against non-professional stretching that may worsen symptoms.  PAIN:  Are you having pain? Yes: NPRS scale: 10/10 Pain location: low back Pain description: ache Aggravating factors: activity Relieving factors: position change  PRECAUTIONS: Fall  RED FLAGS: None  WEIGHT BEARING RESTRICTIONS: No  FALLS:  Has patient fallen in last 6 months? Yes. Number of falls 5  OCCUPATION: not working  PLOF: Independent  PATIENT GOALS: To manage my back pain  NEXT MD VISIT: 10/07/23  OBJECTIVE:  Note: Objective measures were completed at Evaluation unless otherwise noted.  DIAGNOSTIC FINDINGS:  IMPRESSION: 1. Broad-based disc protrusion at L3-4 extending into the foramina bilaterally without focal stenosis. 2. Broad-based disc protrusion at L4-5 with mild foraminal stenosis bilaterally.   Electronically signed by: Lonni Necessary MD 08/08/2023 11:07 AM EDT RP Workstation: [id number]  PATIENT SURVEYS:  Modified Oswestry:   Interpretation of scores: Score Category Description  0-20% Minimal Disability The patient can cope with most living activities. Usually no treatment is indicated apart from advice on lifting, sitting and exercise  21-40% Moderate Disability The patient experiences more pain and difficulty with sitting, lifting and standing. Travel and social life are more difficult and they may be disabled from work. Personal care, sexual activity and sleeping are not grossly affected, and the patient can usually be  managed by conservative means  41-60% Severe Disability Pain remains the main problem in this group, but activities of daily living are affected. These patients require a detailed investigation  61-80% Crippled Back pain impinges on all aspects of the patient's life. Positive intervention is required  81-100% Bed-bound  These patients are either bed-bound or exaggerating their symptoms  Bluford FORBES Zoe DELENA Karon DELENA, et al. Surgery versus conservative management of stable thoracolumbar fracture: the PRESTO feasibility RCT. [place] ([place]): [employer]; 2021 Nov. [facility] Technology Assessment, No. 25.62.) Appendix 3, Oswestry Disability Index category descriptors. Available from: FindJewel[employer].cz  Minimally Clinically Important Difference (MCID) = 12.8%   MUSCLE LENGTH: Hamstrings: Right Marked tightness in sitting; Left Marked tightness in sitting Thomas test: Not tested  POSTURE: UTA as patient is WC bound  PALPATION: deferred  LUMBAR ROM: UTA due to inability to stand safely   AROM eval  Flexion   Extension   Right lateral flexion   Left lateral flexion   Right rotation   Left rotation    (Blank rows = not tested)  LOWER EXTREMITY ROM:   Unable to assess accurately due to guarding  Active  Right eval Left eval  Hip flexion    Hip extension    Hip abduction    Hip adduction    Hip internal rotation    Hip external rotation    Knee flexion    Knee extension    Ankle dorsiflexion    Ankle plantarflexion    Ankle inversion    Ankle eversion     (Blank rows = not tested)  LOWER EXTREMITY MMT:  Unable to assess accurately due to guarding  MMT Right eval Left eval  Hip flexion    Hip extension    Hip abduction    Hip adduction    Hip internal rotation    Hip external rotation    Knee flexion    Knee extension    Ankle dorsiflexion    Ankle plantarflexion    Ankle inversion    Ankle eversion     (Blank rows = not  tested)  LUMBAR SPECIAL TESTS:  Straight leg raise test: UTA and Slump test: UTA  FUNCTIONAL TESTS:  30 seconds chair stand test 0 reps  GAIT: Distance walked: patient is WC bound Assistive device utilized: Wheelchair (manual) Level of assistance: requires PT to propel WC Comments: patient confined to [facility]  TREATMENT:  [facility]  Adult PT Treatment:                                                DATE: 10/04/23 Eval Self Care: Additional minutes spent for educating on updated Therapeutic Home Exercise Program as well as comparing current status to condition at start of symptoms. This included exercises focusing on stretching, strengthening, with focus on eccentric aspects. Long term goals include an improvement in range of motion, strength, endurance as well as avoiding reinjury. Patient's frequency would include in 1-2 times a day, 3-5 times a week for a duration of 6-12 weeks. Proper technique shown and discussed handout in great detail. All questions were discussed and addressed.                                                                                                                             PATIENT EDUCATION:  Education details: Discussed eval findings, rehab rationale and POC and patient is in agreement  Person educated: Patient Education method: Explanation and Handouts Education comprehension: verbalized understanding and needs further education  HOME EXERCISE PROGRAM: Access Code: [email or web address] URL: [email or web address] Date: 10/04/2023 Prepared by: Shoaib Siefker  Exercises - Seated Heel Toe Raises  - 1 x daily - 5 x weekly - 3 sets - 10 reps - Seated Long Arc Quad  - 1 x daily - 5 x weekly - 3 sets - 10 reps - Seated March  - 1 x daily - 5 x weekly - 3 sets - 10 reps - Seated Heel Slide  - 1 x daily - 5 x weekly - 3 sets - 10 reps  ASSESSMENT:  CLINICAL IMPRESSION: Patient is a 48 y.o. female who was seen today for physical therapy evaluation and  treatment for BLE weakness and low back pain. Patient unsafe to transfer or ambulate safely w/o assistance.  Scope of assessment limited by pain, inability to attain test positions, guarding and unstable balance making patient unsafe to assess gait/mobility w/o PT assistance.  Rehab potential guarded based on findings, pending ortho MD f/u 10/07/23  OBJECTIVE IMPAIRMENTS: Abnormal gait, decreased activity tolerance, decreased knowledge of condition, decreased knowledge of use of DME, decreased mobility, difficulty walking, decreased strength, impaired perceived functional ability, increased muscle spasms, postural dysfunction, pain, and global guarding throughout BLEs.   ACTIVITY LIMITATIONS: carrying, lifting, bending, sitting, standing, squatting, sleeping, stairs, and locomotion level  PERSONAL FACTORS: Age, Behavior pattern, Fitness, Past/current experiences, and Time since onset of injury/illness/exacerbation are also affecting patient's functional outcome.   REHAB POTENTIAL: Fair due to MOI as well as symptom presentation  CLINICAL DECISION MAKING: Evolving/moderate complexity  EVALUATION COMPLEXITY: Moderate   GOALS: Goals reviewed with patient? No  LONG TERM GOALS: Target date: 12/04/23  Patient will increase 30s chair stand reps from 0 to 1 with/without arms to demonstrate and improved functional  ability with less pain/difficulty as well as reduce fall risk.  Baseline: 0 Goal status: INITIAL  2.  Patient will acknowledge 6/10 pain at least once during episode of care   Baseline: 10/10 Goal status: INITIAL  3.  Patient will score at least on 22/50 on ODI to signify clinically meaningful improvement in functional abilities.   Baseline: 30/50 Goal status: INITIAL  4.  Patient to demonstrate independence in HEP  Baseline: [id number] Goal status: INITIAL    PLAN:  PT FREQUENCY: 1x/week  PT DURATION: 6 weeks  PLANNED INTERVENTIONS: 97110-Therapeutic exercises, 97530-  Therapeutic activity, [id number]- Neuromuscular re-education, 97535- Self Care, 02859- Manual therapy, [phone]- Gait training, Patient/Family education, Balance training, and Stair training.  PLAN FOR NEXT SESSION: HEP review and update, manual techniques as appropriate, aerobic tasks, ROM and flexibility activities, strengthening and PREs, TPDN, gait and balance training as needed   For all possible CPT codes, reference the Planned Interventions line above.     Check all conditions that are expected to impact treatment: {Conditions expected to impact treatment:None of these apply   If treatment provided at initial evaluation, no treatment charged due to lack of authorization.       Vannak Montenegro M Bryahna Lesko, PT 10/04/2023, 8:26 AM

## 2023-10-04 ENCOUNTER — Other Ambulatory Visit: Payer: Self-pay

## 2023-10-04 ENCOUNTER — Ambulatory Visit: Payer: Self-pay | Attending: Physical Medicine and Rehabilitation

## 2023-10-04 DIAGNOSIS — R2689 Other abnormalities of gait and mobility: Secondary | ICD-10-CM | POA: Diagnosis present

## 2023-10-04 DIAGNOSIS — G8929 Other chronic pain: Secondary | ICD-10-CM | POA: Insufficient documentation

## 2023-10-04 DIAGNOSIS — M5441 Lumbago with sciatica, right side: Secondary | ICD-10-CM | POA: Diagnosis not present

## 2023-10-04 DIAGNOSIS — M5126 Other intervertebral disc displacement, lumbar region: Secondary | ICD-10-CM | POA: Diagnosis present

## 2023-10-04 DIAGNOSIS — M5442 Lumbago with sciatica, left side: Secondary | ICD-10-CM | POA: Insufficient documentation

## 2023-10-04 DIAGNOSIS — R2681 Unsteadiness on feet: Secondary | ICD-10-CM | POA: Insufficient documentation

## 2023-10-04 DIAGNOSIS — M7918 Myalgia, other site: Secondary | ICD-10-CM | POA: Insufficient documentation

## 2023-10-07 ENCOUNTER — Other Ambulatory Visit (INDEPENDENT_AMBULATORY_CARE_PROVIDER_SITE_OTHER): Payer: Self-pay

## 2023-10-07 ENCOUNTER — Ambulatory Visit (INDEPENDENT_AMBULATORY_CARE_PROVIDER_SITE_OTHER): Admitting: Orthopedic Surgery

## 2023-10-07 DIAGNOSIS — M545 Low back pain, unspecified: Secondary | ICD-10-CM

## 2023-10-07 DIAGNOSIS — G8929 Other chronic pain: Secondary | ICD-10-CM | POA: Diagnosis not present

## 2023-10-07 DIAGNOSIS — R2689 Other abnormalities of gait and mobility: Secondary | ICD-10-CM | POA: Diagnosis not present

## 2023-10-07 MED ORDER — METHYLPREDNISOLONE 4 MG PO TBPK
ORAL_TABLET | ORAL | 0 refills | Status: DC
Start: 2023-10-07 — End: 2023-11-19

## 2023-10-07 NOTE — Progress Notes (Signed)
 Orthopedic Spine Surgery Office Note  Assessment: Patient is a 48 y.o. female with severe low back pain with pain along the anterior and medial aspect of the thigh and legs.  No stenosis seen on her lumbar MRI   Plan: -Patient has severe pain in her lumbar spine and pain radiating into her legs.  Her lumbar MRI does not offer any explanation for the symptoms.  She does have a small bulging disc at L4/5 but no significant stenosis.  She inquired about injections.  I told her she could try 1 at L4/5 but I am not sure that is going to give her the results she is seeking. -Since she does not have any significant stenosis, I do not see any surgery for the lumbar spine that will provide her with predictable improvement in her symptoms and do not recommend any lumbar spine surgery -I prescribed a Medrol  Dosepak for additional pain relief -She reports symptoms of imbalance and does have a positive Hoffmann, so ordered a cervical and thoracic MRI to evaluate for myelopathy.  Although I did tell her that that would not explain her severe low back pain - Patient should return to the office in 1 month, x-rays at next visit: None   Patient expressed understanding of the plan and all questions were answered to the patient's satisfaction.   ___________________________________________________________________________   History:  Patient is a 48 y.o. female who presents today for lumbar spine.  Patient states that she visited a chiropractor where an adjustment was done.  She said she was not having any symptoms including pain or radiculopathy or known lumbar issue prior to the chiropractor.  She said she just went for an adjustment but did not have a goal in mind besides that.  She said after that adjustment she noted significant low back pain and pain into her bilateral lower extremities.  She feels the going along the anterior and medial aspect of the thighs and into the legs to the level of the ankle.  She said  she went to the emergency department after this and was given pain medication but that did not help.  She went back to the same chiropractor who did another adjustment.  She did not notice any improvement after that adjustment.  She was told by the chiropractor that her back froze up.  She has since gone to the ER and seen my partner Dr. Eldonna.  She said her pain has gotten worse since onset.  She has had to stop working as a result of the pain.  She has trouble dressing herself and needed bathing herself.  She feels the pain with activity and rest.  She also reports imbalance and unsteadiness.  She does not use any ambulatory assistive devices.  Weakness: Yes, she feels that her legs are weaker Symptoms of imbalance: Yes, said she has difficulty with walking and has to go slower because of unsteadiness.  Does not use any assistive devices Paresthesias and numbness: Denies Bowel or bladder incontinence: Denies Saddle anesthesia: Reports that she has decreased sensation in her groin  Treatments tried: Bracing, physical therapy, Tylenol , Mobic , methocarbamol , narcotics, oral steroids  Review of systems: Denies fevers and chills, night sweats, unexplained weight loss, history of cancer, pain that wakes them at night  Past medical history: Depression/anxiety  Allergies: NKDA  Past surgical history:  None  Social history: Denies use of nicotine product (smoking, vaping, patches, smokeless) Alcohol use: Denies Denies recreational drug use   Physical Exam:  General: no  acute distress, appears stated age Neurologic: alert, answering questions appropriately, following commands Respiratory: unlabored breathing on room air, symmetric chest rise Psychiatric: appropriate affect, normal cadence to speech   MSK (spine):  -Strength exam      Left  Right EHL    5/5  5/5 TA    5/5  5/5 GSC    5/5  5/5 Knee extension  5/5  5/5 Hip flexion   3/5  3/5  Had increased pain with hip flexion  testing  -Sensory exam    Sensation intact to light touch in L3-S1 nerve distributions of bilateral lower extremities  -Achilles DTR: 0/4 on the left, 0/4 on the right -Patellar tendon DTR: 0/4 on the left, 0/4 on the right -Positive Hoffmann on the left, negative on the right -Negative Romberg -No interosseous muscle wasting seen  -Straight leg raise: Negative bilaterally -Clonus: no beats bilaterally  Imaging: XRs of the lumbar spine from 10/07/2023 were independently reviewed and interpreted, showing small anterior osteophyte formation at L3/4.  No other significant degenerative changes.  No evidence of instability on flexion/extension views.  No fracture or dislocation seen.  MRI of the lumbar spine from 08/08/2023 was independently reviewed and interpreted, showing disc bulge at L4/5.  No significant central, lateral recess, or foraminal stenosis.   Patient name: Kristine Baker Patient MRN: [id number] Date of visit: 10/07/23

## 2023-10-11 ENCOUNTER — Encounter: Payer: Self-pay | Admitting: Orthopedic Surgery

## 2023-10-14 ENCOUNTER — Telehealth: Payer: Self-pay | Admitting: Family

## 2023-10-14 ENCOUNTER — Encounter: Payer: Self-pay | Admitting: Physical Medicine and Rehabilitation

## 2023-10-14 DIAGNOSIS — Z0279 Encounter for issue of other medical certificate: Secondary | ICD-10-CM

## 2023-10-14 NOTE — Telephone Encounter (Signed)
 [employer] faxed disability form, to be filled out by provider. Patient requested to send it back via Fax within ASAP. Document is located in providers tray at front office.Please advise at [phone].

## 2023-10-15 ENCOUNTER — Telehealth: Payer: Self-pay

## 2023-10-15 NOTE — Telephone Encounter (Signed)
 Form placed on Providers desk, pt is aware.

## 2023-10-15 NOTE — Telephone Encounter (Signed)
 Copied from CRM [id number]. Topic: General - Other >> Oct 13, 2023  2:36 PM Thersia BROCKS wrote: Reason for CRM: Patient called in regarding disability paperwork , stated she spoke with the company today and they stated they have not received the paperwork yet, would like a callback when that has been completed  Forms placed on providers desk.

## 2023-10-15 NOTE — Telephone Encounter (Signed)
 Copied from CRM #[id number]. Topic: Clinical - Medical Advice >> Oct 14, 2023  1:06 PM Shereese L wrote: Reason for CRM: Patient calling to see if office received document from [employer] to extend days off from work and would like to be called once fax is received   Already received and spoke with pt on 8/21

## 2023-10-15 NOTE — Telephone Encounter (Signed)
 Copied from CRM [id number]. Topic: General - Other >> Oct 14, 2023 11:24 AM Dedra B wrote: Reason for CRM: Pt calling to follow up on paperwork sent by [facility] last week. Called [employer] and they haven't received it. Told pt to have [employer] resend it.  Received.

## 2023-10-17 ENCOUNTER — Other Ambulatory Visit

## 2023-10-18 ENCOUNTER — Telehealth: Payer: Self-pay

## 2023-10-18 NOTE — Telephone Encounter (Signed)
 Copied from CRM #[id number]. Topic: Clinical - Medical Advice >> Oct 14, 2023  1:06 PM Shereese L wrote: Reason for CRM: Patient calling to see if office received document from [employer] to extend days off from work and would like to be called once fax is received >> Oct 18, 2023 11:08 AM Mia F wrote: Pt is following up on paperwork for [facility]. She wants to know if it has been completed and if not if she can be called when it is done.  >> Oct 15, 2023 11:28 AM Chiquita SQUIBB wrote: Patient is requesting a call when the forms have been completed.  >> Oct 14, 2023  2:39 PM Franky GRADE wrote: Patient is calling to follow up, I called [employer] to verify, they asked to transfer call.   I returned pt's call, forms are complete and have been faxed.

## 2023-10-19 ENCOUNTER — Telehealth: Payer: Self-pay | Admitting: Orthopedic Surgery

## 2023-10-19 MED ORDER — METHOCARBAMOL 500 MG PO TABS: 500.0000 mg | ORAL_TABLET | Freq: Four times a day (QID) | ORAL | 1 refills | Status: DC | PRN

## 2023-10-19 NOTE — Telephone Encounter (Signed)
 Pt called requesting refills of methocarbamol  and methylprednisolone . Please send to [facility] [address]. Pt phone number is [phone].

## 2023-10-19 NOTE — Telephone Encounter (Signed)
 I called and pt that this was done and the Medrol  dose pa was not sent in, she states that she understands

## 2023-10-23 ENCOUNTER — Ambulatory Visit
Admission: RE | Admit: 2023-10-23 | Discharge: 2023-10-23 | Disposition: A | Discharge status: Home / Self Care | Source: Ambulatory Visit | Attending: Orthopedic Surgery

## 2023-10-23 ENCOUNTER — Ambulatory Visit
Admission: RE | Admit: 2023-10-23 | Discharge: 2023-10-23 | Disposition: A | Discharge status: Home / Self Care | Source: Ambulatory Visit | Attending: Orthopedic Surgery | Admitting: Orthopedic Surgery

## 2023-10-23 DIAGNOSIS — R2689 Other abnormalities of gait and mobility: Secondary | ICD-10-CM

## 2023-11-01 NOTE — Therapy (Addendum)
 OUTPATIENT PHYSICAL THERAPY TREATMENT NOTE/DISCHARGE   Patient Name: Kristine Baker MRN: [id number] DOB:14-Aug-1975, 48 y.o., female Today's Date: 12/06/2023 PHYSICAL THERAPY DISCHARGE SUMMARY  Visits from Start of Care: 3  Current functional level related to goals / functional outcomes: Goals notmet   Remaining deficits: Pain and mobility   Education / Equipment: HEP   Patient agrees to discharge. Patient goals were not met. Patient is being discharged due to not returning since the last visit.  END OF SESSION:     History reviewed. No pertinent past medical history. History reviewed. No pertinent surgical history. 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 11/19/2023   Chronic bilateral low back pain with bilateral sciatica 09/14/2023    PCP: Lucius Krabbe, NP PCP - General  REFERRING PROVIDER: Trudy Duwaine FORBES, NP  REFERRING DIAG: M54.42,M54.41,G89.29 (ICD-10-CM) - Chronic bilateral low back pain with bilateral sciatica M79.18 (ICD-10-CM) - Myofascial pain syndrome  Rationale for Evaluation and Treatment: Rehabilitation  THERAPY DIAG:  Unsteadiness on feet  HNP (herniated nucleus pulposus), lumbar  Other abnormalities of gait and mobility  ONSET DATE: 4 months  SUBJECTIVE:                                                                                                                                                                                           SUBJECTIVE STATEMENT:  Returns to OPPT with continued symptoms of 10/10 pain.  She is unable to ambulate on her own.     Pain located in lower back. Been having this pain for the past 3 months. Patient says she had went to the chiropractor and says the pain started after that. No PT, does home exercises. She takes Prednisone , eases a little bit. Can't lift leg.  Gets real stiff when she lay down, got to roll out of bed. When sleeping, have muscle spasms.    PERTINENT HISTORY:  Chronic lumbar pain w/radiculpathy Now causing gait instability and lower extremity weakness with several recent falls due to balance issues and leg weakness. MRI shows 2 bulging discs at L3-4 & L4-5. Previous evaluations do not indicate surgery, PT recommended but unable to start as of yet. Medications ineffective. Discussed nerve block or spinal injection. Awaiting neurosurgery follow-up. - Attend neurosurgery appointment on August 14th. - Prescribe prednisone , advise morning administration to prevent insomnia, reminded of other possible SE. - Prescribe Flexeril  for symptom relief and to help with sleep. - Encourage resolution of insurance issues to enable physical therapy. - Advise against non-professional  stretching that may worsen symptoms.  PAIN:  Are you having pain? Yes: NPRS scale: 10/10 Pain location: low back Pain description: ache Aggravating factors: activity Relieving factors: position change  PRECAUTIONS: Fall  RED FLAGS: None   WEIGHT BEARING RESTRICTIONS: No  FALLS:  Has patient fallen in last 6 months? Yes. Number of falls 5  OCCUPATION: not working  PLOF: Independent  PATIENT GOALS: To manage my back pain  NEXT MD VISIT: 10/07/23  OBJECTIVE:  Note: Objective measures were completed at Evaluation unless otherwise noted.  DIAGNOSTIC FINDINGS:  IMPRESSION: 1. Broad-based disc protrusion at L3-4 extending into the foramina bilaterally without focal stenosis. 2. Broad-based disc protrusion at L4-5 with mild foraminal stenosis bilaterally.   Electronically signed by: Lonni Necessary MD 08/08/2023 11:07 AM EDT RP Workstation: [id number]  PATIENT SURVEYS:  Modified Oswestry:   Interpretation of scores: Score Category Description  0-20% Minimal Disability The patient can cope with most living activities. Usually no treatment is  indicated apart from advice on lifting, sitting and exercise  21-40% Moderate Disability The patient experiences more pain and difficulty with sitting, lifting and standing. Travel and social life are more difficult and they may be disabled from work. Personal care, sexual activity and sleeping are not grossly affected, and the patient can usually be managed by conservative means  41-60% Severe Disability Pain remains the main problem in this group, but activities of daily living are affected. These patients require a detailed investigation  61-80% Crippled Back pain impinges on all aspects of the patient's life. Positive intervention is required  81-100% Bed-bound  These patients are either bed-bound or exaggerating their symptoms  Bluford FORBES Zoe DELENA Karon DELENA, et al. Surgery versus conservative management of stable thoracolumbar fracture: the PRESTO feasibility RCT. [place]): [employer]; 2021 Nov. [facility] Technology Assessment, No. 25.62.) Appendix 3, Oswestry Disability Index category descriptors. Available from: FindJewelers.cz  Minimally Clinically Important Difference (MCID) = 12.8%   MUSCLE LENGTH: Hamstrings: Right Marked tightness in sitting; Left Marked tightness in sitting Thomas test: Not tested  POSTURE: UTA as patient is WC bound  PALPATION: deferred  LUMBAR ROM: UTA due to inability to stand safely   AROM eval  Flexion   Extension   Right lateral flexion   Left lateral flexion   Right rotation   Left rotation    (Blank rows = not tested)  LOWER EXTREMITY ROM:   Unable to assess accurately due to guarding  Active  Right eval Left eval  Hip flexion    Hip extension    Hip abduction    Hip adduction    Hip internal rotation    Hip external rotation    Knee flexion    Knee extension    Ankle dorsiflexion    Ankle plantarflexion    Ankle inversion    Ankle eversion     (Blank rows = not tested)  LOWER  EXTREMITY MMT:  Unable to assess accurately due to guarding  MMT Right eval Left eval  Hip flexion    Hip extension    Hip abduction    Hip adduction    Hip internal rotation    Hip external rotation    Knee flexion    Knee extension    Ankle dorsiflexion    Ankle plantarflexion    Ankle inversion    Ankle eversion     (Blank rows = not tested)  LUMBAR SPECIAL TESTS:  Straight leg raise test: UTA and Slump test: UTA  FUNCTIONAL  TESTS:  30 seconds chair stand test 0 reps  GAIT: Distance walked: patient is WC bound Assistive device utilized: Wheelchair (manual) Level of assistance: requires PT to propel WC Comments: patient confined to [facility]  TREATMENT:  [facility] Adult PT Treatment:                                                DATE: 11/02/23 Self Care: Vital Signs O2 sat 99% resting HR 148, BP sitting L arm 139/99 129 HR, 5 min later 139/93 HR 129 Assisted patient with WC/mat transfers requiring additional time and CGA of PT due to pain and elevated extensor tone levels in lumbar extensors and quadriceps.  S required with unsupported sitting.  Elevated resting HR excluded patient from safe participation in treatment.  Education forvided on S&S of HTN and need to seek medical attention if warning signs appear.  [facility] Adult PT Treatment:                                                DATE: 10/04/23 Eval Self Care: Additional minutes spent for educating on updated Therapeutic Home Exercise Program as well as comparing current status to condition at start of symptoms. This included exercises focusing on stretching, strengthening, with focus on eccentric aspects. Long term goals include an improvement in range of motion, strength, endurance as well as avoiding reinjury. Patient's frequency would include in 1-2 times a day, 3-5 times a week for a duration of 6-12 weeks. Proper technique shown and discussed handout in great detail. All questions were discussed and addressed.                                                                                                                              PATIENT EDUCATION:  Education details: Discussed eval findings, rehab rationale and POC and patient is in agreement  Person educated: Patient Education method: Explanation and Handouts Education comprehension: verbalized understanding and needs further education  HOME EXERCISE PROGRAM: Access Code: [email or web address] URL: [email or web address] Date: 10/04/2023 Prepared by: Channie Bostick  Exercises - Seated Heel Toe Raises  - 1 x daily - 5 x weekly - 3 sets - 10 reps - Seated Long Arc Quad  - 1 x daily - 5 x weekly - 3 sets - 10 reps - Seated March  - 1 x daily - 5 x weekly - 3 sets - 10 reps - Seated Heel Slide  - 1 x daily - 5 x weekly - 3 sets - 10 reps  ASSESSMENT:  CLINICAL IMPRESSION: Arrived for tratment but HR excluded patient from safe participation in treatment.  (See self care section)   Patient is a 49 y.o. female who  was seen today for physical therapy evaluation and treatment for BLE weakness and low back pain. Patient unsafe to transfer or ambulate safely w/o assistance.  Scope of assessment limited by pain, inability to attain test positions, guarding and unstable balance making patient unsafe to assess gait/mobility w/o PT assistance.  Rehab potential guarded based on findings, pending ortho MD f/u 10/07/23  OBJECTIVE IMPAIRMENTS: Abnormal gait, decreased activity tolerance, decreased knowledge of condition, decreased knowledge of use of DME, decreased mobility, difficulty walking, decreased strength, impaired perceived functional ability, increased muscle spasms, postural dysfunction, pain, and global guarding throughout BLEs.   ACTIVITY LIMITATIONS: carrying, lifting, bending, sitting, standing, squatting, sleeping, stairs, and locomotion level  PERSONAL FACTORS: Age, Behavior pattern, Fitness, Past/current experiences, and Time since onset of  injury/illness/exacerbation are also affecting patient's functional outcome.   REHAB POTENTIAL: Fair due to MOI as well as symptom presentation  CLINICAL DECISION MAKING: Evolving/moderate complexity  EVALUATION COMPLEXITY: Moderate   GOALS: Goals reviewed with patient? No  LONG TERM GOALS: Target date: 12/04/23  Patient will increase 30s chair stand reps from 0 to 1 with/without arms to demonstrate and improved functional ability with less pain/difficulty as well as reduce fall risk.  Baseline: 0 Goal status: INITIAL  2.  Patient will acknowledge 6/10 pain at least once during episode of care   Baseline: 10/10 Goal status: INITIAL  3.  Patient will score at least on 22/50 on ODI to signify clinically meaningful improvement in functional abilities.   Baseline: 30/50 Goal status: INITIAL  4.  Patient to demonstrate independence in HEP  Baseline: [id number] Goal status: INITIAL    PLAN:  PT FREQUENCY: 1x/week  PT DURATION: 6 weeks  PLANNED INTERVENTIONS: 97110-Therapeutic exercises, 97530- Therapeutic activity, [id number]- Neuromuscular re-education, 97535- Self Care, 02859- Manual therapy, [phone]- Gait training, Patient/Family education, Balance training, and Stair training.  PLAN FOR NEXT SESSION: HEP review and update, manual techniques as appropriate, aerobic tasks, ROM and flexibility activities, strengthening and PREs, TPDN, gait and balance training as needed   For all possible CPT codes, reference the Planned Interventions line above.     Check all conditions that are expected to impact treatment: {Conditions expected to impact treatment:None of these apply   If treatment provided at initial evaluation, no treatment charged due to lack of authorization.       Jmarion Christiano M Elijio Staples, PT 12/06/2023, 9:48 AM

## 2023-11-02 ENCOUNTER — Ambulatory Visit: Attending: Physical Medicine and Rehabilitation

## 2023-11-02 DIAGNOSIS — M5126 Other intervertebral disc displacement, lumbar region: Secondary | ICD-10-CM | POA: Diagnosis present

## 2023-11-02 DIAGNOSIS — R2681 Unsteadiness on feet: Secondary | ICD-10-CM | POA: Diagnosis present

## 2023-11-02 DIAGNOSIS — R2689 Other abnormalities of gait and mobility: Secondary | ICD-10-CM | POA: Insufficient documentation

## 2023-11-04 ENCOUNTER — Encounter

## 2023-11-08 ENCOUNTER — Ambulatory Visit

## 2023-11-11 ENCOUNTER — Ambulatory Visit: Admitting: Orthopedic Surgery

## 2023-11-11 ENCOUNTER — Telehealth: Payer: Self-pay | Admitting: Family

## 2023-11-11 DIAGNOSIS — M545 Low back pain, unspecified: Secondary | ICD-10-CM | POA: Diagnosis not present

## 2023-11-11 MED ORDER — TRAMADOL HCL 50 MG PO TABS: 50.0000 mg | ORAL_TABLET | Freq: Four times a day (QID) | ORAL | 0 refills | Status: AC | PRN

## 2023-11-11 NOTE — Telephone Encounter (Signed)
 [facility] faxed Disability forms , to be filled out by provider. Patient requested to send it back via Fax within ASAP. Document is located in providers tray at front office.Please advise at [phone].

## 2023-11-11 NOTE — Progress Notes (Signed)
 Orthopedic Spine Surgery Office Note   Patient comes in today for MRI review.  MRI images were shown to her and gone over with her in the office.  She has not noticed any changes in her symptoms.  She is still having severe low back pain.   I do not see any significant central stenosis that could contribute to her symptoms of myelopathy.  I do not see a central stenosis that could cause radiating spine pain.  She does not have any stenosis or acute pathology seen on her lumbar spine MRI to explain her acute onset of pain after chiropractic manipulation.  For that reason, I do not see any surgical intervention that would provide her with predictable relief of her pain.  I prescribed tramadol  to help with her pain.  I also referred her to pain management.    Imaging: XRs of the lumbar spine from 10/07/2023 were previously independently reviewed and interpreted, showing small anterior osteophyte formation at L3/4.  No other significant degenerative changes.  No evidence of instability on flexion/extension views.  No fracture or dislocation seen.   MRI of the lumbar spine from 08/08/2023 was previously independently reviewed and interpreted, showing disc bulge at L4/5.  No significant central, lateral recess, or foraminal stenosis.  MRI of the thoracic spine from 10/23/2023 was independently reviewed and interpreted, showing no significant central stenosis.  No T2 cord signal change seen.  No significant foraminal stenosis seen.  MRI of the cervical spine from 10/23/2023 was independently reviewed and interpreted, showing disc herniation with foraminal stenosis on the right at C3/4.  Foraminal stenosis on the left at C5/6.  Foraminal stenosis on the left at C7/T1.     Patient name: Kristine Baker Patient MRN: [id number] Date of visit: 11/11/23

## 2023-11-12 NOTE — Telephone Encounter (Signed)
 Received

## 2023-11-15 ENCOUNTER — Ambulatory Visit

## 2023-11-15 ENCOUNTER — Telehealth: Payer: Self-pay

## 2023-11-15 NOTE — Telephone Encounter (Signed)
 Forms placed on PCP's desk.   Copied from CRM [id number]. Topic: General - Other >> Nov 15, 2023  1:18 PM Thersia C wrote: Reason for CRM: Patient called in would like a callback once the paperwork has been signed and completed

## 2023-11-15 NOTE — Telephone Encounter (Signed)
 Patient called to check about an appointment for injections in her back.  Stated that Dr. Georgina was referring her to a place on [address] and that office stated they had not received a referral from our office. Stated she would check with that office and get back with us .

## 2023-11-16 ENCOUNTER — Telehealth: Payer: Self-pay

## 2023-11-16 ENCOUNTER — Telehealth: Payer: Self-pay | Admitting: Radiology

## 2023-11-16 NOTE — Telephone Encounter (Signed)
 Patient called, said she has not heard from pain management.  I told her it can take a few weeks for them to review chart and call her.  She did not get pain meds from pharmacy last week, will call pharmacy to make sure they have it ready, and will call us  with any other needs in interim.

## 2023-11-16 NOTE — Telephone Encounter (Signed)
 I called patient and advised, fax shows that it was transmitted successfully to both numbers. Patient will call back if she has continued problems.

## 2023-11-16 NOTE — Telephone Encounter (Signed)
 Patient  called stating that  the pain management clinic states that they have not received any referral from our office.  She is asking for someone to please give her a call in regards to this, she states she is in a lot of pain.  [phone]  [facility] [address] [place], [place] [address]  Phone: [phone]

## 2023-11-16 NOTE — Telephone Encounter (Signed)
 Patient called stating [facility] did not receive her referral and asked that we resend it.  I completed New Patient Intake Form and faxed it, referral, demographics,and clinicals to [facility] at [phone].

## 2023-11-16 NOTE — Telephone Encounter (Signed)
 Referral has been faxed x 2 with confirmation that fax was received. I called [facility] and they asked that I fax to [phone]. Intake form, referral, demographics, and clinicals faxed.

## 2023-11-17 NOTE — Telephone Encounter (Unsigned)
 Copied from CRM #[id number]. Topic: General - Call Back - No Documentation >> Nov 17, 2023 11:41 AM Alfonso HERO wrote: Reason for CRM: Patient calling to see if Disability forms were received from the [facility]. She would like a call back.

## 2023-11-18 ENCOUNTER — Emergency Department (HOSPITAL_COMMUNITY)
Admission: EM | Admit: 2023-11-18 | Discharge: 2023-11-18 | Disposition: A | Discharge status: Home / Self Care | Attending: Emergency Medicine | Admitting: Emergency Medicine

## 2023-11-18 ENCOUNTER — Other Ambulatory Visit (HOSPITAL_BASED_OUTPATIENT_CLINIC_OR_DEPARTMENT_OTHER): Payer: Self-pay

## 2023-11-18 DIAGNOSIS — M5431 Sciatica, right side: Secondary | ICD-10-CM | POA: Diagnosis not present

## 2023-11-18 DIAGNOSIS — M549 Dorsalgia, unspecified: Secondary | ICD-10-CM | POA: Diagnosis present

## 2023-11-18 DIAGNOSIS — M5432 Sciatica, left side: Secondary | ICD-10-CM | POA: Diagnosis not present

## 2023-11-18 LAB — CBC WITH DIFFERENTIAL/PLATELET
Abs Immature Granulocytes: 0.02 K/uL (ref 0.00–0.07)
Basophils Absolute: 0.1 K/uL (ref 0.0–0.1)
Basophils Relative: 1 %
Eosinophils Absolute: 0.1 K/uL (ref 0.0–0.5)
Eosinophils Relative: 1 %
HCT: 39.1 % (ref 36.0–46.0)
Hemoglobin: 13.4 g/dL (ref 12.0–15.0)
Immature Granulocytes: 0 %
Lymphocytes Relative: 26 %
Lymphs Abs: 1.8 K/uL (ref 0.7–4.0)
MCH: 28 pg (ref 26.0–34.0)
MCHC: 34.3 g/dL (ref 30.0–36.0)
MCV: 81.6 fL (ref 80.0–100.0)
Monocytes Absolute: 0.5 K/uL (ref 0.1–1.0)
Monocytes Relative: 6 %
Neutro Abs: 4.6 K/uL (ref 1.7–7.7)
Neutrophils Relative %: 66 %
Platelets: 282 K/uL (ref 150–400)
RBC: 4.79 MIL/uL (ref 3.87–5.11)
RDW: 13.7 % (ref 11.5–15.5)
WBC: 7 K/uL (ref 4.0–10.5)
nRBC: 0 % (ref 0.0–0.2)

## 2023-11-18 LAB — MAGNESIUM: Magnesium: 2.6 mg/dL — ABNORMAL HIGH (ref 1.7–2.4)

## 2023-11-18 LAB — COMPREHENSIVE METABOLIC PANEL WITH GFR
ALT: 12 U/L (ref 0–44)
AST: 21 U/L (ref 15–41)
Albumin: 4.3 g/dL (ref 3.5–5.0)
Alkaline Phosphatase: 59 U/L (ref 38–126)
Anion gap: 12 (ref 5–15)
BUN: 11 mg/dL (ref 6–20)
CO2: 22 mmol/L (ref 22–32)
Calcium: 9.8 mg/dL (ref 8.9–10.3)
Chloride: 108 mmol/L (ref 98–111)
Creatinine, Ser: 0.77 mg/dL (ref 0.44–1.00)
GFR, Estimated: >60 mL/min (ref >60)
Glucose, Bld: 81 mg/dL (ref 70–99)
Potassium: 3.8 mmol/L (ref 3.5–5.1)
Sodium: 142 mmol/L (ref 135–145)
Total Bilirubin: 1.4 mg/dL — ABNORMAL HIGH (ref 0.0–1.2)
Total Protein: 7.1 g/dL (ref 6.5–8.1)

## 2023-11-18 LAB — SEDIMENTATION RATE: Sed Rate: 2 mm/h (ref 0–22)

## 2023-11-18 LAB — PHOSPHORUS: Phosphorus: 3.7 mg/dL (ref 2.5–4.6)

## 2023-11-18 LAB — C-REACTIVE PROTEIN: CRP: 0.5 mg/dL (ref <1.0)

## 2023-11-18 LAB — CK: Total CK: 127 U/L (ref 38–234)

## 2023-11-18 MED ORDER — ACETAMINOPHEN 500 MG PO TABS
1000.0000 mg | ORAL_TABLET | Freq: Once | ORAL | Status: AC
  Administered 2023-11-18: 1000 mg via ORAL
  Filled 2023-11-18: qty 2

## 2023-11-18 MED ORDER — METHYLPREDNISOLONE 4 MG PO TBPK: ORAL_TABLET | ORAL | 0 refills | Status: DC

## 2023-11-18 MED ORDER — METHOCARBAMOL 500 MG PO TABS
750.0000 mg | ORAL_TABLET | Freq: Once | ORAL | Status: AC
  Administered 2023-11-18: 750 mg via ORAL
  Filled 2023-11-18: qty 2

## 2023-11-18 MED ORDER — METHYLPREDNISOLONE 4 MG PO TBPK
ORAL_TABLET | ORAL | 0 refills | Status: DC
  Filled 2023-11-18: qty 21, 6d supply, fill #0

## 2023-11-18 MED ORDER — HYDROMORPHONE HCL 1 MG/ML IJ SOLN
1.0000 mg | Freq: Once | INTRAMUSCULAR | Status: AC
  Administered 2023-11-18: 1 mg via INTRAVENOUS
  Filled 2023-11-18: qty 1

## 2023-11-18 NOTE — Telephone Encounter (Signed)
 I returned pt's call in regards to Disability forms. Please schedule pt for a virtual visit on 11/19/2023 at 12:00 pm with PCP.  Thank you!

## 2023-11-18 NOTE — ED Triage Notes (Signed)
 Pt BIB EMS from attorney office c/o sever back pain. States chiropractor caused pain. Seeing neurologist currently with reported normal MRI. Spasms with locked up legs. Takes muscle relaxers.    EMS vitals  BP 174/100 HR 100 SPO2 97 RA

## 2023-11-18 NOTE — ED Provider Notes (Signed)
 [place] EMERGENCY DEPARTMENT AT [facility] Provider Note   CSN: [id number] Arrival date & time: 11/18/23  9061     Patient presents with: Back Pain   Kristine Baker is a 48 y.o. female.   HPI Patient has history of persistent back pain now since March.  She has undergone MRI and consultation with orthopedics and physical therapy.  Based on MRI and assessment from Dr. Ozell Ada, currently no indication for surgical intervention for likely improvement of pain.  Patient reports that she has gotten very limited and sporadic pain relief from other interventions and medications.  She reports today she got a lot of spasm and stiffness in her legs.  She reports that it goes down her medial in front of the legs and then it just feels really hard for her to move.  Currently she is not describing pain that is focusing so much in the lower back as intense stiffness of the legs.  Reports it almost feels like it is preventing her from bending her legs.  No associated abdominal pain.    Prior to Admission medications   Medication Sig Start Date End Date Taking? Authorizing Provider  levonorgestrel  (MIRENA ) 20 MCG/DAY IUD 1 each by Intrauterine route once.    [provider]  lidocaine  (LIDODERM ) 5 % Place 1 patch onto the skin daily. Remove & Discard patch within 12 hours or as directed by MD 07/08/23   Lucius Krabbe, NP  meloxicam  (MOBIC ) 15 MG tablet Take 1 tablet (15 mg total) by mouth daily. 08/26/23   Myriam Dorn BROCKS, PA  methocarbamol  (ROBAXIN ) 500 MG tablet Take 1 tablet (500 mg total) by mouth every 6 (six) hours as needed (pain, muscle spasms). 10/19/23   Ada Ozell LABOR, MD  methylPREDNISolone  (MEDROL  DOSEPAK) 4 MG TBPK tablet Take as prescribed on the box 10/07/23   Ada Ozell LABOR, MD  methylPREDNISolone  (MEDROL  DOSEPAK) 4 MG TBPK tablet Take per dose pack instructions 11/18/23   Armenta Canning, MD  naproxen  (NAPROSYN ) 500 MG tablet Take 1 tablet (500 mg  total) by mouth 2 (two) times daily with a meal. 07/02/23   Lucius Krabbe, NP    Allergies: Patient has no known allergies.    Review of Systems  Updated Vital Signs BP 121/72 (BP Location: Right Arm)   Pulse 67   Temp 98.3 F (36.8 C) (Oral)   Resp 15   Ht 5' 5 (1.651 m)   Wt 56.7 kg   SpO2 94%   BMI 20.80 kg/m   Physical Exam Constitutional:      Comments: Patient is in a hallway stretcher bed.  As I approach, no appearance of acute distress.  Patient is looking at her phone and appears relatively calm and comfortable.  No respiratory distress.  HENT:     Mouth/Throat:     Pharynx: Oropharynx is clear.  Eyes:     Extraocular Movements: Extraocular movements intact.  Cardiovascular:     Rate and Rhythm: Normal rate and regular rhythm.  Pulmonary:     Effort: Pulmonary effort is normal.     Breath sounds: Normal breath sounds.  Abdominal:     Comments: Scaphoid.  Patient is very thin.  Abdomen nontender.  I do not appreciate any focal mass.  Musculoskeletal:     Comments: No peripheral edema.  The legs are symmetric.  Patient is quite thin.  Not specifically muscular atrophy of extremities however all 4 extremities lack significant muscular development symmetrically.  Patient has  pronounced lower back lordosis but spine appears straight.  As the patient rolls onto her side to assist in examination, she maintains a back quite straight as well as the legs.  Does not endorse pain to palpation of the soft tissues of the back or buttocks.  Positioned laterally onto her left side, patient does have musculature of gluteal and lower extremities engaged to maintain straight positioning..  Feet are warm and dry.  No wounds of the feet ankles or lower legs.  No peripheral edema.  Dorsalis pedis pulses are 2+ and symmetric  Skin:    General: Skin is warm and dry.  Neurological:     General: No focal deficit present.     Mental Status: She is oriented to person, place, and time.      (all labs ordered are listed, but only abnormal results are displayed) Labs Reviewed  COMPREHENSIVE METABOLIC PANEL WITH GFR - Abnormal; Notable for the following components:      Result Value   Total Bilirubin 1.4 (*)    All other components within normal limits  MAGNESIUM - Abnormal; Notable for the following components:   Magnesium 2.6 (*)    All other components within normal limits  CBC WITH DIFFERENTIAL/PLATELET  PHOSPHORUS  CK  SEDIMENTATION RATE  C-REACTIVE PROTEIN    EKG: None  Radiology: No results found.   Procedures   Medications Ordered in the ED  HYDROmorphone  (DILAUDID ) injection 1 mg (1 mg Intravenous Given 11/18/23 1151)  methocarbamol  (ROBAXIN ) tablet 750 mg (750 mg Oral Given 11/18/23 1201)  acetaminophen  (TYLENOL ) tablet 1,000 mg (1,000 mg Oral Given 11/18/23 1152)                                    Medical Decision Making Amount and/or Complexity of Data Reviewed Labs: ordered.  Risk OTC drugs. Prescription drug management.   Patient presents without.  She has had extensive diagnostic workup for back pain since March.  This has included MRIs which have not shown any critical impingement.  Patient has been seen by orthopedic surgery and no surgical recommendations.  Patient does describe a lot of muscle spasm and tightness.  I will proceed with some basic lab work.  Patient has not had labs since the beginning of July.  Will treat for acute on chronic pain.  Patient treated with Dilaudid  1 mg Robaxin -750 milligrams and acetaminophen  1000 mg.  CRP 0.5 and sedimentation rate 2.  Low probability of autoimmune condition such as rheumatoid or SLE as source of patient's muscular pain combined with back pain.  CK 127.  Low probability of any type of myositis.  Magnesium 2.6 phosphorus 3.7 sodium 142 potassium 3.8 normal GFR.  No apparent electrolyte derangement contributing to symptoms.  CBC normal with normal differential.  Lower probability of  any type of infectious etiology.  Patient does not have fever or other clinical symptoms that would suggest discitis.  She has already had MRI which has not shown these changes.  Patient is vascular exam is normal.  She has good pedal pulses with warm extremities.  Do not suspect vascular dysfunction is contributing factor at this time.  After reevaluation, patient has got good pain relief with the Dilaudid , Robaxin  and acetaminophen .  At this time we will have the patient take a Medrol  Dosepak, continue Robaxin  and extra strength Tylenol .  She is aware of the need to follow-up with her outpatient providers to  continue pursuing pain management on outpatient basis.     Final diagnoses:  Bilateral sciatica    ED Discharge Orders          Ordered    methylPREDNISolone  (MEDROL  DOSEPAK) 4 MG TBPK tablet  Status:  Discontinued        11/18/23 1504    methylPREDNISolone  (MEDROL  DOSEPAK) 4 MG TBPK tablet        11/18/23 1517               Armenta Canning, MD 11/18/23 1540

## 2023-11-18 NOTE — Discharge Instructions (Signed)
 1.  Take Medrol  Dosepak per package instructions.  You may start it today and take all of the doses today.  Then continue tomorrow on the second day instructions. 2.  Take Robaxin  as prescribed.  This is a muscle relaxer to help with muscle spasms. 3.  You may also take extra strength Tylenol  every 6 hours for additional pain control. 4.  Continue to work with your outpatient providers for pain management.

## 2023-11-19 ENCOUNTER — Telehealth (INDEPENDENT_AMBULATORY_CARE_PROVIDER_SITE_OTHER): Admitting: Family

## 2023-11-19 DIAGNOSIS — M5442 Lumbago with sciatica, left side: Secondary | ICD-10-CM | POA: Diagnosis not present

## 2023-11-19 DIAGNOSIS — M4802 Spinal stenosis, cervical region: Secondary | ICD-10-CM

## 2023-11-19 DIAGNOSIS — M5021 Other cervical disc displacement,  high cervical region: Secondary | ICD-10-CM

## 2023-11-19 DIAGNOSIS — M51369 Other intervertebral disc degeneration, lumbar region without mention of lumbar back pain or lower extremity pain: Secondary | ICD-10-CM | POA: Diagnosis not present

## 2023-11-19 DIAGNOSIS — M5441 Lumbago with sciatica, right side: Secondary | ICD-10-CM

## 2023-11-19 DIAGNOSIS — G8929 Other chronic pain: Secondary | ICD-10-CM

## 2023-11-19 MED ORDER — GABAPENTIN 300 MG PO CAPS: 300.0000 mg | ORAL_CAPSULE | Freq: Two times a day (BID) | ORAL | 0 refills | Status: DC

## 2023-11-19 NOTE — Assessment & Plan Note (Signed)
 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 Continued gait instability and lower extremity weakness with several recent falls due to balance issues and leg weakness. MRI shows 2 bulging discs at L3-4 & L4-5. Previous evaluations do not indicate surgery, and recently seen by neurosurgery who agree, unable to do PT.  Medications ineffective. Discussed nerve block or spinal injection. Awaiting pain clinic follow-up. - Send additional referral to [facility] to see if can be seen sooner. - Prescribe Gabapentin  300mg  bid, advised on SE, can alternate with Tramadol  if not driving. - Call both [facility] and [facility] next week to see if can be scheduled - ST disability forms completed with end date of 02/23/2024, pt may need LT disability forms completed prior to then.

## 2023-11-19 NOTE — Progress Notes (Signed)
 MyChart Video Visit    Virtual Visit via Video Note   This format is felt to be most appropriate for this patient at this time. Physical exam was limited by quality of the video and audio technology used for the visit. CMA was able to get the patient set up on a video visit.  Patient location: Home. Patient and provider in visit Provider location: Office  I discussed the limitations of evaluation and management by telemedicine and the availability of in person appointments. The patient expressed understanding and agreed to proceed.  Visit Date: 11/19/2023  Today's healthcare provider: Lucius Krabbe, NP     Subjective:   Patient ID: Kristine Baker, female    DOB: [birth year], 48 y.o.   MRN: [id number]  Chief Complaint  Patient presents with   FMLA  Discussed the use of AI scribe software for clinical note transcription with the patient, who gave verbal consent to proceed.  History of Present Illness Kristine Baker is a 48 year old female who presents with balance issues and falls.  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 Continued gait instability and lower extremity weakness with several recent falls due to balance issues and leg weakness. MRI shows 2 bulging discs at L3-4 & L4-5. Previous evaluations do not indicate surgery, and recently seen by neurosurgery who agree, unable to do PT.  Medications ineffective. Discussed nerve block or spinal injection. Awaiting pain clinic follow-up. - Send additional referral to [facility] to see if can be seen sooner. - Prescribe Gabapentin  300mg  bid, advised on SE, can alternate with Tramadol  if not driving. - Call both [facility] and [facility] next week to see if can be scheduled - ST disability forms completed with end date of 02/23/2024, pt may need LT disability forms completed prior to then.   No past medical history on file.  No past surgical history on file.  Outpatient Medications Prior to Visit  Medication Sig Dispense Refill   levonorgestrel  (MIRENA ) 20 MCG/DAY IUD 1 each by Intrauterine route once.     lidocaine  (LIDODERM ) 5 % Place 1 patch onto the skin daily. Remove & Discard patch within 12 hours or as directed by MD 30 patch 2  methocarbamol  (ROBAXIN ) 500 MG tablet Take 1 tablet (500 mg total) by mouth every 6 (six) hours as needed (pain, muscle spasms). 60 tablet 1   methylPREDNISolone  (MEDROL  DOSEPAK) 4 MG TBPK tablet Take per dose pack instructions 21 tablet 0   traMADol  (ULTRAM ) 50 MG tablet Take 1 tablet by mouth every 6 (six) hours as needed for moderate  pain (pain score 4-6).     meloxicam  (MOBIC ) 15 MG tablet Take 1 tablet (15 mg total) by mouth daily. 30 tablet 0   methylPREDNISolone  (MEDROL  DOSEPAK) 4 MG TBPK tablet Take as prescribed on the box 21 tablet 0   naproxen  (NAPROSYN ) 500 MG tablet Take 1 tablet (500 mg total) by mouth 2 (two) times daily with a meal. 30 tablet 0   No facility-administered medications prior to visit.    No Known Allergies     Objective:   Physical Exam Vitals and nursing note reviewed.  Constitutional:      General: Pt is not in acute distress.    Appearance: Normal appearance.  HENT:     Head: Normocephalic.  Pulmonary:     Effort: No respiratory distress.  Musculoskeletal:     Cervical back: Normal range of motion. Visualized pt slowly standing and walking with walker from bedroom to kitchen. Skin:    General: Skin is dry.     Coloration: Skin is not pale.  Neurological:     Mental Status: Pt is alert and oriented to person, place, and time.  Psychiatric:        Mood and Affect: Mood normal.   There were no vitals taken for this visit.  Wt Readings from Last 3 Encounters:  11/18/23 125 lb (56.7 kg)  08/08/23 125 lb (56.7 kg)  08/04/23 130 lb (59 kg)      I discussed the assessment and treatment plan with the patient. The patient was provided an opportunity to ask questions and all were answered. The patient agreed with the plan and demonstrated an understanding of the instructions.   The patient was advised to call back or seek an in-person evaluation if the symptoms worsen or if the condition fails to improve as anticipated.  Lucius Krabbe, NP [facility] at [facility] [phone] (phone) [phone] (fax)  [facility]

## 2023-11-24 ENCOUNTER — Telehealth: Payer: Self-pay

## 2023-11-24 NOTE — Telephone Encounter (Signed)
 Patient called stating that she hasn't heard anything yet from [facility]er[facility]. States she wants to know if Dr.Moore has any other places that she can go to have the procedure done.  Please give patient a call at [phone] to discuss the options with her.

## 2023-11-26 ENCOUNTER — Telehealth: Payer: Self-pay

## 2023-11-26 NOTE — Telephone Encounter (Signed)
 Faxed over forms along with office notes. I reached out to pt and left a voicemail.   Copied from CRM #[id number]. Topic: General - Other >> Nov 26, 2023 11:02 AM Kristine Baker wrote: Reason for CRM: Patient is calling because she was informed by [employer] that her short term leave documents were not filled out completely and they re-faxed a copy to the office to complete the forms. Please contact the patient once completed so she can follow up with them. She did not receive payment due to incomplete forms.

## 2023-11-29 ENCOUNTER — Other Ambulatory Visit (HOSPITAL_BASED_OUTPATIENT_CLINIC_OR_DEPARTMENT_OTHER): Payer: Self-pay

## 2023-11-30 NOTE — Telephone Encounter (Signed)
 Patient dropped off document FMLA, to be filled out by provider. Patient requested to send it back via Call Patient to pick up within 5-days. Document is located in providers tray at front office.Please advise at Mobile [phone] (mobile)

## 2023-11-30 NOTE — Telephone Encounter (Signed)
 Paperwork picked up from front office and placed on providers desk.

## 2023-12-01 ENCOUNTER — Ambulatory Visit (INDEPENDENT_AMBULATORY_CARE_PROVIDER_SITE_OTHER): Admitting: Family

## 2023-12-01 ENCOUNTER — Encounter: Payer: Self-pay | Admitting: Family

## 2023-12-01 VITALS — BP 130/84 | HR 92 | Temp 97.2°F | Ht 65.0 in | Wt 114.0 lb

## 2023-12-01 DIAGNOSIS — M5442 Lumbago with sciatica, left side: Secondary | ICD-10-CM

## 2023-12-01 DIAGNOSIS — M62830 Muscle spasm of back: Secondary | ICD-10-CM | POA: Diagnosis not present

## 2023-12-01 DIAGNOSIS — M62838 Other muscle spasm: Secondary | ICD-10-CM | POA: Diagnosis not present

## 2023-12-01 DIAGNOSIS — M5441 Lumbago with sciatica, right side: Secondary | ICD-10-CM

## 2023-12-01 DIAGNOSIS — R634 Abnormal weight loss: Secondary | ICD-10-CM | POA: Diagnosis not present

## 2023-12-01 DIAGNOSIS — G8929 Other chronic pain: Secondary | ICD-10-CM | POA: Diagnosis not present

## 2023-12-01 MED ORDER — DIAZEPAM 5 MG PO TABS: 5.0000 mg | ORAL_TABLET | Freq: Every day | ORAL | 0 refills | Status: AC | PRN

## 2023-12-01 NOTE — Assessment & Plan Note (Signed)
 Chronic pain and muscle spasms partially managed with medications. Gabapentin  300mg  bid effective without causing dizziness or drowsiness. Valium effective but advised to use sparingly due to dependency risk. - Continue gabapentin  300mg  bid, may increase to tid if needed. - Use Valium as needed for severe symptoms, reserve for bothersome cases. - Do not leave Valium unattended due to theft and dependency risk. - Avoid alcohol with medications. - Refill gabapentin , provide one-time Valium refill, PDMP checked & verified, controlled substance contract reviewed, signed and scanned to chart. - New FMLA forms signed and given to patient, copy scanned to chart. - F/U in 3 mos

## 2023-12-01 NOTE — Progress Notes (Signed)
 Patient ID: Kristine Baker, female    DOB: 1975/10/14, 48 y.o.   MRN: [id number]  Chief Complaint  Patient presents with   Follow-up    ED follow up on ED for leg spasms  Discussed the use of AI scribe software for clinical note transcription with the patient, who gave verbal consent to proceed.  History of Present Illness   Kristine Baker is a 48 year old female who presents with difficulty moving her legs and muscle spasms.  She experiences significant difficulty moving her legs, requiring a wheelchair and assistance for transfers. Muscle spasms and sharp pains occur, lasting two days at a time. Methocarbamol  provides slight relief but does not fully alleviate symptoms. Gabapentin  is taken twice daily and is helping her to walk and stand for longer periods without causing dizziness or drowsiness. Valium was given in the at [facility] ED on 10/2 which provided her the most relief. A RX was sent in to the wrong pharmacy and she is needing a new one sent today.  Multiple MRIs of her neck and back are normal. Despite this, symptoms persist, and she has been referred to a neurosurgeon and possibly a neurology appointment. She has lost weight from 130 pounds to 114 pounds and has a decreased appetite, though she attempts to eat regularly. She is managing short-term disability and FMLA paperwork due to her condition's impact on her employment. She has been unable to work since March 2025.     Assessment and Plan    Neuromuscular disorder, unspecified (under evaluation) Seen by [facility] in [place] on 10/2. Suspected neuromuscular disorder affecting mobility, possibly involving the spine or neurological issues. Previous MRIs normal. Further evaluation needed to rule out multiple sclerosis, other NM disorders. Called and scheduled appointment with [facility] here in [place] for next Tuesday. - Continue neurology referral and follow up on November appointment with pain clinic. - Ensure all medical records  from [facility] are available for neurology appointment.  Chronic pain syndrome with muscle spasms Chronic pain and muscle spasms partially managed with medications. Gabapentin  300mg  bid effective without causing dizziness or drowsiness. Valium effective but advised to use sparingly due to dependency risk. - Continue gabapentin  300mg  bid, may increase to tid if needed. - Use Valium as needed for severe symptoms, reserve for bothersome cases. - Do not leave Valium unattended due to theft and dependency risk. - Avoid alcohol with medications. - Refill gabapentin , provide one-time Valium refill, PDMP checked & verified, controlled substance contract reviewed, signed and scanned to chart. - New FMLA forms completed, signed and given to patient, copy scanned to chart. - F/U in 3 mos  Unintentional weight loss Significant weight loss from 130 lbs to 114 lbs. Lack of appetite possibly related to medications or health issues. Gabapentin  may aid appetite. - Encourage balanced diet with protein and vegetables. - Monitor weight and appetite changes, report concerns.      Subjective:    Outpatient Medications Prior to Visit  Medication Sig Dispense Refill   diazepam (VALIUM) 5 MG tablet Take 5 mg by mouth.     gabapentin  (NEURONTIN ) 300 MG capsule Take 1 capsule (300 mg total) by mouth 2 (two) times daily. For nerve pain down legs. May cause drowsiness. 60 capsule 0   levonorgestrel  (MIRENA ) 20 MCG/DAY IUD 1 each by Intrauterine route once.     methocarbamol  (ROBAXIN ) 500 MG tablet Take 1 tablet (500 mg total) by mouth every 6 (six) hours as needed (pain, muscle spasms).  60 tablet 1   traMADol  (ULTRAM ) 50 MG tablet Take 1 tablet by mouth every 6 (six) hours as needed for moderate pain (pain score 4-6).     lidocaine  (LIDODERM ) 5 % Place 1 patch onto the skin daily. Remove & Discard patch within 12 hours or as directed by MD 30 patch 2   methylPREDNISolone  (MEDROL  DOSEPAK) 4 MG TBPK tablet Take per dose  pack instructions 21 tablet 0   No facility-administered medications prior to visit.   No past medical history on file. No past surgical history on file. No Known Allergies    Objective:    Physical Exam Vitals and nursing note reviewed.  Constitutional:      Appearance: Normal appearance.  Cardiovascular:     Rate and Rhythm: Normal rate and regular rhythm.  Pulmonary:     Effort: Pulmonary effort is normal.     Breath sounds: Normal breath sounds.  Musculoskeletal:     Lumbar back: Spasms and tenderness present. Decreased range of motion.  Skin:    General: Skin is warm and dry.  Neurological:     Mental Status: She is alert.     Gait: Gait abnormal (requires walker for steadier gait).  Psychiatric:        Mood and Affect: Mood normal.        Behavior: Behavior normal.    BP 130/84 (BP Location: Left Arm, Patient Position: Sitting, Cuff Size: Large)   Pulse 92   Temp (!) 97.2 F (36.2 C) (Temporal)   Ht 5' 5 (1.651 m)   Wt 114 lb (51.7 kg)   LMP  (LMP Unknown)   SpO2 98%   BMI 18.97 kg/m  Wt Readings from Last 3 Encounters:  12/01/23 114 lb (51.7 kg)  11/18/23 125 lb (56.7 kg)  08/08/23 125 lb (56.7 kg)   *I personally spent a total of 30 minutes in the care of the patient today including preparing to see the patient, getting/reviewing separately obtained history, performing a medically appropriate exam/evaluation, counseling and educating, placing orders, referring and communicating with other health care professionals, and documenting clinical information in the EHR.     Kristine Krabbe, NP

## 2023-12-23 ENCOUNTER — Other Ambulatory Visit: Payer: Self-pay | Admitting: Family

## 2023-12-23 DIAGNOSIS — G8929 Other chronic pain: Secondary | ICD-10-CM

## 2023-12-23 DIAGNOSIS — M51369 Other intervertebral disc degeneration, lumbar region without mention of lumbar back pain or lower extremity pain: Secondary | ICD-10-CM

## 2023-12-27 ENCOUNTER — Encounter: Payer: Self-pay | Admitting: Radiology

## 2023-12-28 ENCOUNTER — Ambulatory Visit: Admitting: Student in an Organized Health Care Education/Training Program

## 2023-12-30 ENCOUNTER — Other Ambulatory Visit: Payer: Self-pay | Admitting: Family

## 2023-12-30 DIAGNOSIS — M51369 Other intervertebral disc degeneration, lumbar region without mention of lumbar back pain or lower extremity pain: Secondary | ICD-10-CM

## 2023-12-30 DIAGNOSIS — G8929 Other chronic pain: Secondary | ICD-10-CM

## 2023-12-30 NOTE — Telephone Encounter (Unsigned)
 Copied from CRM [id number]. Topic: Clinical - Medication Refill >> Dec 30, 2023  1:06 PM Franky GRADE wrote: Medication: methocarbamol  (ROBAXIN ) 500 MG tablet [[id number]], traMADol  (ULTRAM ) 50 MG tablet [[id number]], gabapentin  (NEURONTIN ) 300 MG capsule [[id number]]  Has the patient contacted their pharmacy? No (Agent: If no, request that the patient contact the pharmacy for the refill. If patient does not wish to contact the pharmacy document the reason why and proceed with request.) (Agent: If yes, when and what did the pharmacy advise?)  This is the patient's preferred pharmacy:  Store [phone]  [facility] at    [address] [place], [place] [address]  Is this the correct pharmacy for this prescription? Yes If no, delete pharmacy and type the correct one.   Has the prescription been filled recently? No  Is the patient out of the medication? Yes  Has the patient been seen for an appointment in the last year OR does the patient have an upcoming appointment? Yes  Can we respond through MyChart? Yes  Agent: Please be advised that Rx refills may take up to 3 business days. We ask that you follow-up with your pharmacy.

## 2023-12-31 MED ORDER — GABAPENTIN 300 MG PO CAPS: 300.0000 mg | ORAL_CAPSULE | Freq: Two times a day (BID) | ORAL | 0 refills | Status: DC

## 2023-12-31 MED ORDER — METHOCARBAMOL 500 MG PO TABS: 500.0000 mg | ORAL_TABLET | Freq: Four times a day (QID) | ORAL | 1 refills | Status: DC | PRN

## 2023-12-31 MED ORDER — TRAMADOL HCL 50 MG PO TABS: 50.0000 mg | ORAL_TABLET | Freq: Four times a day (QID) | ORAL | 0 refills | Status: DC | PRN

## 2024-01-27 ENCOUNTER — Ambulatory Visit: Admitting: Family

## 2024-01-27 ENCOUNTER — Encounter: Payer: Self-pay | Admitting: Family

## 2024-01-27 VITALS — BP 110/68 | HR 110 | Temp 97.9°F | Ht 65.0 in | Wt 117.0 lb

## 2024-01-27 DIAGNOSIS — R790 Abnormal level of blood mineral: Secondary | ICD-10-CM | POA: Diagnosis not present

## 2024-01-27 DIAGNOSIS — M5442 Lumbago with sciatica, left side: Secondary | ICD-10-CM

## 2024-01-27 DIAGNOSIS — R7989 Other specified abnormal findings of blood chemistry: Secondary | ICD-10-CM | POA: Diagnosis not present

## 2024-01-27 DIAGNOSIS — G8929 Other chronic pain: Secondary | ICD-10-CM

## 2024-01-27 DIAGNOSIS — M51369 Other intervertebral disc degeneration, lumbar region without mention of lumbar back pain or lower extremity pain: Secondary | ICD-10-CM | POA: Diagnosis not present

## 2024-01-27 DIAGNOSIS — M5441 Lumbago with sciatica, right side: Secondary | ICD-10-CM

## 2024-01-27 LAB — TSH: TSH: 3.37 m[IU]/L

## 2024-01-27 LAB — T4, FREE: Free T4: 1.1 ng/dL (ref 0.8–1.8)

## 2024-01-27 MED ORDER — TRAMADOL HCL 50 MG PO TABS: 50.0000 mg | ORAL_TABLET | Freq: Four times a day (QID) | ORAL | 0 refills | Status: AC | PRN

## 2024-01-27 MED ORDER — METHOCARBAMOL 500 MG PO TABS: 500.0000 mg | ORAL_TABLET | Freq: Four times a day (QID) | ORAL | 2 refills | Status: AC | PRN

## 2024-01-27 MED ORDER — GABAPENTIN 300 MG PO CAPS: 300.0000 mg | ORAL_CAPSULE | Freq: Three times a day (TID) | ORAL | 2 refills | Status: AC

## 2024-01-27 NOTE — Progress Notes (Signed)
 Patient ID: [name], female    DOB: 1975/07/21, 48 y.o.   MRN: [id number]  Chief Complaint  Patient presents with   Chronic bilateral low back pain with bilateral sciatica    Follow up  Discussed the use of AI scribe software for clinical note transcription with the patient, who gave verbal consent to proceed.  History of Present Illness Kristine Baker is a 48 year old female with chronic bilateral low back pain and sciatica who presents for follow-up. She is accompanied by her sister.  She has chronic bilateral low back pain with sciatica down both legs, with stiffness and impaired mobility. Pain is worse at night and can be severe enough that she cannot move her legs. She takes gabapentin , prescribed twice daily but has been using it three times daily at 9 AM, 4 PM, and 10 PM, and she recently ran out. She also uses methocarbamol  3 times per day prn and has taken tramadol  in the past for pain. She has attended physical therapy and water therapy and has not had injections, but has a follow up appointment on Dec 10 with [facility]. Three weeks ago she fell while walking and carrying a bag because of leg weakness. She has been out of work since July and wants to return but feels unable due to her pain and weakness. Recent labs showed low copper and elevated TSH levels. She has reduced soda and increased water. She notes leg weakness and reduced strength using a walker to ambulate. Tingling and numbness in her legs have improved with gabapentin .  Assessment & Plan Chronic bilateral low back pain with bilateral sciatica Chronic low back pain with sciatica. Normal neurology labs and MRI. Gabapentin  effective for tingling and numbness, but pain persists. Muscle spasms occur if gabapentin  is missed. Tramadol  used sparingly due to drowsiness risk. Ongoing physical and water therapy. Awaiting further evaluation and potential injections. - Increased gabapentin  300mg  to three times  daily, refill sent. - Continue methocarbamol  500mg  tid as needed for muscle spasms, refill sent. - Use tramadol  50mg  sparingly, up to three times daily as needed for severe pain, refill sent. - Keep appt with pain clinic on 12/10. - Follow up with neurology in February.. - Consider personal care services through Medicaid for assistance with daily activities.  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 Copper supplementation recommended to address potential neurological effects. - Obtain OTC copper supplement from pharmacy and follow dosing instructions on the bottle (unable to send RX thru EMR).  Abnormal thyroid  function studies Mildly elevated TSH. Potential influence of supplements on thyroid  function. No immediate thyroid  dysfunction, monitoring advised. - Recheck TSH and free T4 today  Subjective:    Outpatient Medications Prior to Visit  Medication Sig Dispense Refill   diazepam  (VALIUM ) 5 MG tablet Take 1 tablet (5 mg total) by mouth daily as needed for anxiety or muscle spasms (May cause drowsiness.). Take only prn to avoid dependence on medication. 30 tablet 0   gabapentin  (NEURONTIN ) 300 MG capsule Take 1 capsule (300 mg total) by mouth 2 (two) times daily. For nerve pain down legs. May cause drowsiness. 60 capsule 0   levonorgestrel  (MIRENA ) 20 MCG/DAY IUD 1 each by Intrauterine route once.     methocarbamol  (ROBAXIN ) 500 MG tablet Take 1 tablet (500 mg total) by mouth every 6 (six) hours as needed (pain, muscle spasms). 60 tablet 1   traMADol  (ULTRAM ) 50 MG tablet Take 1 tablet (50  mg total) by mouth every 6 (six) hours as needed for moderate pain (pain score 4-6). 30 tablet 0   No facility-administered medications prior to visit.   No past medical history on file. No past surgical history on file. No Known Allergies    Objective:    Physical Exam Vitals and nursing note reviewed.  Constitutional:      Appearance: Normal appearance.  Cardiovascular:      Rate and Rhythm: Normal rate and regular rhythm.  Pulmonary:     Effort: Pulmonary effort is normal.     Breath sounds: Normal breath sounds.  Musculoskeletal:        General: Normal range of motion.  Skin:    General: Skin is warm and dry.  Neurological:     Mental Status: She is alert.  Psychiatric:        Mood and Affect: Mood normal.        Behavior: Behavior normal.    BP 110/68 (BP Location: Left Arm, Patient Position: Sitting, Cuff Size: Normal)   Pulse (!) 110   Temp 97.9 F (36.6 C) (Temporal)   Ht 5' 5 (1.651 m)   Wt 117 lb (53.1 kg)   SpO2 96%   BMI 19.47 kg/m  Wt Readings from Last 3 Encounters:  01/27/24 117 lb (53.1 kg)  12/01/23 114 lb (51.7 kg)  11/18/23 125 lb (56.7 kg)      Lucius Krabbe, NP

## 2024-01-28 ENCOUNTER — Ambulatory Visit: Payer: Self-pay | Admitting: Family

## 2024-02-25 ENCOUNTER — Ambulatory Visit: Admitting: Family

## 2024-03-07 ENCOUNTER — Ambulatory Visit: Admitting: Family

## 2024-03-08 ENCOUNTER — Encounter: Payer: Self-pay | Admitting: Family

## 2024-03-08 ENCOUNTER — Ambulatory Visit: Admitting: Family

## 2024-03-08 VITALS — BP 102/80 | HR 111 | Temp 98.4°F | Resp 16 | Ht 65.0 in | Wt 113.6 lb

## 2024-03-08 DIAGNOSIS — G2582 Stiff-man syndrome: Secondary | ICD-10-CM

## 2024-03-08 DIAGNOSIS — R760 Raised antibody titer: Secondary | ICD-10-CM | POA: Diagnosis not present

## 2024-03-08 NOTE — Progress Notes (Signed)
 "  Patient ID: Kristine Baker, female    DOB: 1975/05/13, 49 y.o.   MRN: [id number]  Chief Complaint  Patient presents with   Follow-up    Wants discuss health concerns with provider only.   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   Discussed the use of AI scribe software for clinical note transcription with the patient, who gave verbal consent to proceed.  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 She has back stiffness and spasms with a lordosis posture, balance problems, and muscle spasms. She has not had any seizures. Her current medications are gabapentin , methocarbamol , and Valium  as needed. Valium  provides some relief with the stiffness. Blood work including thyroid  testing was done about two months ago and thyroid  results were normal. She is unsure of the other results.  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 Managed with gabapentin , Valium , and methocarbamol . Differential diagnosis ruled out viral causes. - Continue gabapentin , Valium  as needed, and methocarbamol . - Follow up with specialist for potential immunotherapy treatment options.  Subjective:    Outpatient Medications Prior to Visit  Medication Sig Dispense Refill   diazepam  (VALIUM ) 5 MG tablet Take 1 tablet (5 mg total) by mouth daily as needed for anxiety or muscle spasms (May cause drowsiness.). Take only prn to avoid dependence on medication. 30 tablet 0   gabapentin  (NEURONTIN ) 300 MG capsule Take 1 capsule (300 mg total) by mouth 3 (three) times daily. For nerve pain down legs. May cause  drowsiness. 90 capsule 2   levonorgestrel  (MIRENA ) 20 MCG/DAY IUD 1 each by Intrauterine route once.     methocarbamol  (ROBAXIN ) 500 MG tablet Take 1 tablet (500 mg total) by mouth every 6 (six) hours as needed (pain, muscle spasms). 90 tablet 2   traMADol  (ULTRAM ) 50 MG tablet Take 1 tablet (50 mg total) by mouth every 6 (six) hours as needed for moderate pain (pain score 4-6) or severe pain (pain score 7-10). 20 tablet 0   No facility-administered medications prior to visit.   History reviewed. No pertinent past medical history. History reviewed. No pertinent surgical history. Allergies[1]    Objective:    Physical Exam Vitals and nursing note reviewed.  Constitutional:      Appearance: Normal appearance.  Cardiovascular:     Rate and Rhythm: Normal rate and regular rhythm.  Pulmonary:     Effort: Pulmonary effort is normal.     Breath sounds: Normal breath sounds.  Musculoskeletal:     Thoracic back: Spasms present. Decreased range of motion.     Lumbar back: Spasms present. Decreased range of motion.  Skin:    General: Skin is warm and dry.  Neurological:     Mental Status: She is alert.  Psychiatric:        Mood and Affect: Mood normal.        Behavior: Behavior normal.    BP 102/80   Pulse (!) 111   Temp 98.4 F (36.9 C) (Temporal)   Resp 16   Ht 5' 5 (1.651 m)   Wt 113 lb 9.6 oz (51.5  kg)   SpO2 98%   BMI 18.90 kg/m  Wt Readings from Last 3 Encounters:  03/08/24 113 lb 9.6 oz (51.5 kg)  01/27/24 117 lb (53.1 kg)  12/01/23 114 lb (51.7 kg)      Giabella Duhart, NP     [1] No Known Allergies  "

## 2024-07-11 ENCOUNTER — Encounter: Admitting: Family
